# Patient Record
Sex: Male | Born: 1955 | Race: White | Hispanic: No | Marital: Married | Smoking: Former smoker
Health system: Southern US, Community
[De-identification: ages and names within clinical notes are randomized; demographics above are authoritative.]

## PROBLEM LIST (undated history)

## (undated) DIAGNOSIS — M75101 Unspecified rotator cuff tear or rupture of right shoulder, not specified as traumatic: Secondary | ICD-10-CM

## (undated) DIAGNOSIS — L409 Psoriasis, unspecified: Secondary | ICD-10-CM

## (undated) DIAGNOSIS — C679 Malignant neoplasm of bladder, unspecified: Secondary | ICD-10-CM

## (undated) DIAGNOSIS — R7303 Prediabetes: Secondary | ICD-10-CM

## (undated) DIAGNOSIS — M1990 Unspecified osteoarthritis, unspecified site: Secondary | ICD-10-CM

## (undated) DIAGNOSIS — E785 Hyperlipidemia, unspecified: Secondary | ICD-10-CM

## (undated) DIAGNOSIS — K219 Gastro-esophageal reflux disease without esophagitis: Secondary | ICD-10-CM

## (undated) DIAGNOSIS — I10 Essential (primary) hypertension: Secondary | ICD-10-CM

## (undated) DIAGNOSIS — E119 Type 2 diabetes mellitus without complications: Secondary | ICD-10-CM

## (undated) DIAGNOSIS — G5600 Carpal tunnel syndrome, unspecified upper limb: Secondary | ICD-10-CM

## (undated) DIAGNOSIS — C801 Malignant (primary) neoplasm, unspecified: Secondary | ICD-10-CM

## (undated) HISTORY — PX: CYSTOURETHROSCOPY: SHX476

## (undated) HISTORY — PX: CHOLECYSTECTOMY: SHX55

## (undated) HISTORY — PX: HERNIA REPAIR: SHX51

## (undated) HISTORY — PX: TRANSURETHRAL RESECTION OF BLADDER TUMOR WITH GYRUS (TURBT-GYRUS): SHX6458

## (undated) HISTORY — PX: JOINT REPLACEMENT: SHX530

---

## 2015-07-21 DIAGNOSIS — C801 Malignant (primary) neoplasm, unspecified: Secondary | ICD-10-CM

## 2015-07-21 HISTORY — DX: Malignant (primary) neoplasm, unspecified: C80.1

## 2017-04-08 ENCOUNTER — Encounter: Payer: Self-pay | Admitting: *Deleted

## 2017-04-09 ENCOUNTER — Ambulatory Visit: Payer: BLUE CROSS/BLUE SHIELD | Admitting: Anesthesiology

## 2017-04-09 ENCOUNTER — Encounter: Payer: Self-pay | Admitting: *Deleted

## 2017-04-09 ENCOUNTER — Ambulatory Visit
Admission: RE | Admit: 2017-04-09 | Discharge: 2017-04-09 | Disposition: A | Discharge status: Home / Self Care | Payer: BLUE CROSS/BLUE SHIELD | Source: Ambulatory Visit | Attending: Unknown Physician Specialty | Admitting: Unknown Physician Specialty

## 2017-04-09 ENCOUNTER — Encounter
Admission: RE | Disposition: A | Discharge status: Home / Self Care | Payer: Self-pay | Source: Ambulatory Visit | Attending: Unknown Physician Specialty

## 2017-04-09 DIAGNOSIS — E119 Type 2 diabetes mellitus without complications: Secondary | ICD-10-CM | POA: Insufficient documentation

## 2017-04-09 DIAGNOSIS — K640 First degree hemorrhoids: Secondary | ICD-10-CM | POA: Diagnosis not present

## 2017-04-09 DIAGNOSIS — Z1211 Encounter for screening for malignant neoplasm of colon: Secondary | ICD-10-CM | POA: Insufficient documentation

## 2017-04-09 DIAGNOSIS — Z8551 Personal history of malignant neoplasm of bladder: Secondary | ICD-10-CM | POA: Insufficient documentation

## 2017-04-09 DIAGNOSIS — Z87891 Personal history of nicotine dependence: Secondary | ICD-10-CM | POA: Insufficient documentation

## 2017-04-09 DIAGNOSIS — Z79899 Other long term (current) drug therapy: Secondary | ICD-10-CM | POA: Diagnosis not present

## 2017-04-09 DIAGNOSIS — K635 Polyp of colon: Secondary | ICD-10-CM | POA: Diagnosis not present

## 2017-04-09 DIAGNOSIS — I10 Essential (primary) hypertension: Secondary | ICD-10-CM | POA: Diagnosis not present

## 2017-04-09 DIAGNOSIS — Z6838 Body mass index (BMI) 38.0-38.9, adult: Secondary | ICD-10-CM | POA: Diagnosis not present

## 2017-04-09 HISTORY — DX: Malignant (primary) neoplasm, unspecified: C80.1

## 2017-04-09 HISTORY — DX: Type 2 diabetes mellitus without complications: E11.9

## 2017-04-09 HISTORY — PX: COLONOSCOPY WITH PROPOFOL: SHX5780

## 2017-04-09 HISTORY — DX: Carpal tunnel syndrome, unspecified upper limb: G56.00

## 2017-04-09 HISTORY — DX: Essential (primary) hypertension: I10

## 2017-04-09 SURGERY — COLONOSCOPY WITH PROPOFOL
Anesthesia: General

## 2017-04-09 MED ORDER — LIDOCAINE HCL (PF) 2 % IJ SOLN
INTRAMUSCULAR | Status: DC | PRN
  Administered 2017-04-09: 40 mg

## 2017-04-09 MED ORDER — LIDOCAINE HCL (PF) 2 % IJ SOLN
INTRAMUSCULAR | Status: AC
  Filled 2017-04-09: qty 2

## 2017-04-09 MED ORDER — SODIUM CHLORIDE 0.9 % IV SOLN
INTRAVENOUS | Status: DC
  Administered 2017-04-09: 10:00:00 via INTRAVENOUS
  Administered 2017-04-09: 1000 mL via INTRAVENOUS

## 2017-04-09 MED ORDER — FENTANYL CITRATE (PF) 100 MCG/2ML IJ SOLN
INTRAMUSCULAR | Status: AC
  Filled 2017-04-09: qty 2

## 2017-04-09 MED ORDER — PROPOFOL 10 MG/ML IV BOLUS
INTRAVENOUS | Status: DC | PRN
  Administered 2017-04-09: 20 mg via INTRAVENOUS
  Administered 2017-04-09: 30 mg via INTRAVENOUS

## 2017-04-09 MED ORDER — FENTANYL CITRATE (PF) 100 MCG/2ML IJ SOLN
INTRAMUSCULAR | Status: DC | PRN
Start: 2017-04-09 — End: 2017-04-09
  Administered 2017-04-09 (×2): 50 ug via INTRAVENOUS

## 2017-04-09 MED ORDER — MIDAZOLAM HCL 5 MG/5ML IJ SOLN
INTRAMUSCULAR | Status: DC | PRN
  Administered 2017-04-09: 2 mg via INTRAVENOUS

## 2017-04-09 MED ORDER — PROPOFOL 500 MG/50ML IV EMUL
INTRAVENOUS | Status: AC
  Filled 2017-04-09: qty 50

## 2017-04-09 MED ORDER — MIDAZOLAM HCL 2 MG/2ML IJ SOLN
INTRAMUSCULAR | Status: AC
  Filled 2017-04-09: qty 2

## 2017-04-09 MED ORDER — PROPOFOL 500 MG/50ML IV EMUL
INTRAVENOUS | Status: DC | PRN
  Administered 2017-04-09: 75 ug/kg/min via INTRAVENOUS

## 2017-04-09 MED ORDER — SODIUM CHLORIDE 0.9 % IV SOLN: INTRAVENOUS | Status: DC

## 2017-04-09 NOTE — H&P (Signed)
   Primary Care Physician:  Elaina Pattee, MD Primary Gastroenterologist:  Dr. Vira Agar  Pre-Procedure History & Physical: HPI:  Patrick Mason is a 61 y.o. male is here for an colonoscopy.   Past Medical History:  Diagnosis Date  . 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 Carpal tunnel syndrome   . Diabetes mellitus without complication ([place])   . Hypertension     Past Surgical History:  Procedure Laterality Date  . CHOLECYSTECTOMY    . CYSTOURETHROSCOPY    . HERNIA REPAIR    . TRANSURETHRAL RESECTION OF BLADDER TUMOR WITH GYRUS (TURBT-GYRUS)      Prior to Admission medications   Medication Sig Start Date End Date Taking? Authorizing Provider  atorvastatin (LIPITOR) 10 MG tablet Take 10 mg by mouth daily.   Yes [provider]  losartan-hydrochlorothiazide (HYZAAR) 100-12.5 MG tablet Take 1 tablet by mouth daily.   Yes [provider]  meloxicam (MOBIC) 15 MG tablet Take 15 mg by mouth daily.   Yes [provider]    Allergies as of 12/29/2016  . (Not on File)    History reviewed. No pertinent family history.  Social History   Social History  . Marital status: Married    Spouse name: N/A  . Number of children: N/A  . Years of education: N/A   Occupational History  . Not on file.   Social History Main Topics  . Smoking status: Former Smoker    Years: 40.00    Types: Cigarettes  . Smokeless tobacco: Never Used  . Alcohol use Yes     Comment: OCCASSIONAL  . Drug use: No  . Sexual activity: Not on file   Other Topics Concern  . Not on file   Social History Narrative  . No narrative on file    Review of Systems: See HPI, otherwise negative ROS  Physical Exam: BP 140/68   Pulse 92   Temp (!) 97.1 F (36.2 C) (Tympanic)   Resp 16   Ht 5\' 9"  (1.753 m)   Wt 117.9 kg (260 lb)   SpO2 96%   BMI 38.40 kg/m  General:   Alert,  pleasant and cooperative in NAD Head:  Normocephalic and atraumatic. Neck:  Supple; no  masses or thyromegaly. Lungs:  Clear throughout to auscultation.    Heart:  Regular rate and rhythm. Abdomen:  Soft, nontender and nondistended. Normal bowel sounds, without guarding, and without rebound.   Neurologic:  Alert and  oriented x4;  grossly normal neurologically.  Impression/Plan: Patrick Mason is here for an colonoscopy to be performed for colon cancer screening.  Risks, benefits, limitations, and alternatives regarding  colonoscopy have been reviewed with the patient.  Questions have been answered.  All parties agreeable.   Gaylyn Cheers, MD  04/09/2017, 9:05 AM

## 2017-04-09 NOTE — Anesthesia Preprocedure Evaluation (Signed)
Anesthesia Evaluation  Patient identified by MRN, date of birth, ID band Patient awake    Reviewed: Allergy & Precautions, H&P , NPO status , Patient's Chart, lab work & pertinent test results, reviewed documented beta blocker date and time   Airway Mallampati: IV   Neck ROM: full    Dental  (+) Poor Dentition, Teeth Intact   Pulmonary neg pulmonary ROS, former smoker,  Probable sleep apnea.  Never tested.    Pulmonary exam normal        Cardiovascular Exercise Tolerance: Good hypertension, On Medications negative cardio ROS Normal cardiovascular exam Rhythm:regular Rate:Normal     Neuro/Psych  Neuromuscular disease negative neurological ROS  negative psych ROS   GI/Hepatic negative GI ROS, Neg liver ROS,   Endo/Other  negative endocrine ROSdiabetesMorbid obesity  Renal/GU negative Renal ROS  negative genitourinary   Musculoskeletal   Abdominal   Peds  Hematology negative hematology ROS (+)   Anesthesia Other Findings Past Medical History: No date: Cancer ([place])     Comment:  posterior wall of bladder No date: Carpal tunnel syndrome No date: Diabetes mellitus without complication ([facility]) No date: Hypertension Past Surgical History: No date: CHOLECYSTECTOMY No date: CYSTOURETHROSCOPY No date: HERNIA REPAIR No date: TRANSURETHRAL RESECTION OF BLADDER TUMOR WITH GYRUS (TURBT- GYRUS) BMI    Body Mass Index:  38.40 kg/m     Reproductive/Obstetrics negative OB ROS                             Anesthesia Physical Anesthesia Plan  ASA: III  Anesthesia Plan: General   Post-op Pain Management:    Induction:   PONV Risk Score and Plan:   Airway Management Planned:   Additional Equipment:   Intra-op Plan:   Post-operative Plan:   Informed Consent: I have reviewed the patients History and Physical, chart, labs and discussed the procedure including the risks, benefits and  alternatives for the proposed anesthesia with the patient or authorized representative who has indicated his/her understanding and acceptance.   Dental Advisory Given  Plan Discussed with: CRNA  Anesthesia Plan Comments:         Anesthesia Quick Evaluation

## 2017-04-09 NOTE — Op Note (Signed)
[facility] Gastroenterology Patient Name: Patrick Mason Procedure Date: 04/09/2017 9:02 AM MRN: [id number] Account #: [id number] Date of Birth: 1955/12/25 Admit Type: Outpatient Age: 61 Room: [facility] ENDO ROOM 3 Gender: Male Note Status: Finalized Procedure:            Colonoscopy Indications:          Screening for colorectal malignant neoplasm Providers:            Manya Silvas, MD Medicines:            Propofol per Anesthesia Complications:        No immediate complications. Procedure:            Pre-Anesthesia Assessment:                       - After reviewing the risks and benefits, the patient                        was deemed in satisfactory condition to undergo the                        procedure.                       After obtaining informed consent, the colonoscope was                        passed under direct vision. Throughout the procedure,                        the patient's blood pressure, pulse, and oxygen                        saturations were monitored continuously. The                        Colonoscope was introduced through the anus and                        advanced to the the cecum, identified by appendiceal                        orifice and ileocecal valve. The colonoscopy was                        performed without difficulty. The patient tolerated the                        procedure well. The quality of the bowel preparation                        was excellent. Findings:      Three sessile polyps were found in the sigmoid colon. The polyps were       small in size. These polyps were removed with a hot snare. Resection and       retrieval were complete.      Internal hemorrhoids were found during endoscopy. The hemorrhoids were       small and Grade I (internal hemorrhoids that do not prolapse).      The exam was otherwise without abnormality. Impression:           -  Three small polyps in the sigmoid colon, removed with                       a hot snare. Resected and retrieved.                       - Internal hemorrhoids.                       - The examination was otherwise normal. Recommendation:       - Await pathology results. Manya Silvas, MD 04/09/2017 9:38:23 AM This report has been signed electronically. Number of Addenda: 0 Note Initiated On: 04/09/2017 9:02 AM Scope Withdrawal Time: 0 hours 12 minutes 46 seconds  Total Procedure Duration: 0 hours 22 minutes 7 seconds       [facility]

## 2017-04-09 NOTE — Anesthesia Post-op Follow-up Note (Signed)
Anesthesia QCDR form completed.        

## 2017-04-09 NOTE — Transfer of Care (Signed)
Immediate Anesthesia Transfer of Care Note  Patient: Patrick Mason  Procedure(s) Performed: Procedure(s): COLONOSCOPY WITH PROPOFOL (N/A)  Patient Location: PACU  Anesthesia Type:General  Level of Consciousness: sedated  Airway & Oxygen Therapy: Patient Spontanous Breathing and Patient connected to nasal cannula oxygen  Post-op Assessment: Report given to RN and Post -op Vital signs reviewed and stable  Post vital signs: Reviewed and stable  Last Vitals:  Vitals:   04/09/17 0745  BP: 140/68  Pulse: 92  Resp: 16  Temp: (!) 36.2 C  SpO2: 96%    Last Pain:  Vitals:   04/09/17 0745  TempSrc: Tympanic         Complications: No apparent anesthesia complications

## 2017-04-12 ENCOUNTER — Encounter: Payer: Self-pay | Admitting: Unknown Physician Specialty

## 2017-04-12 LAB — SURGICAL PATHOLOGY

## 2017-04-12 NOTE — Anesthesia Postprocedure Evaluation (Signed)
Anesthesia Post Note  Patient: Jaxton Bryden  Procedure(s) Performed: Procedure(s) (LRB): COLONOSCOPY WITH PROPOFOL (N/A)  Patient location during evaluation: PACU Anesthesia Type: General Level of consciousness: awake and alert Pain management: pain level controlled Vital Signs Assessment: post-procedure vital signs reviewed and stable Respiratory status: spontaneous breathing, nonlabored ventilation, respiratory function stable and patient connected to nasal cannula oxygen Cardiovascular status: blood pressure returned to baseline and stable Postop Assessment: no apparent nausea or vomiting Anesthetic complications: no     Last Vitals:  Vitals:   04/09/17 1001 04/09/17 1011  BP: 133/80 123/80  Pulse: 85 83  Resp: 16 14  Temp:    SpO2: 96% 97%    Last Pain:  Vitals:   04/09/17 0941  TempSrc: Tympanic                 Molli Barrows

## 2017-05-05 ENCOUNTER — Other Ambulatory Visit: Payer: Self-pay | Admitting: Unknown Physician Specialty

## 2017-05-05 DIAGNOSIS — M75101 Unspecified rotator cuff tear or rupture of right shoulder, not specified as traumatic: Secondary | ICD-10-CM

## 2017-05-05 DIAGNOSIS — G5603 Carpal tunnel syndrome, bilateral upper limbs: Secondary | ICD-10-CM

## 2017-05-10 ENCOUNTER — Encounter
Admission: RE | Admit: 2017-05-10 | Discharge: 2017-05-10 | Disposition: A | Discharge status: Home / Self Care | Payer: BLUE CROSS/BLUE SHIELD | Source: Ambulatory Visit | Attending: Unknown Physician Specialty | Admitting: Unknown Physician Specialty

## 2017-05-10 DIAGNOSIS — E119 Type 2 diabetes mellitus without complications: Secondary | ICD-10-CM | POA: Diagnosis not present

## 2017-05-10 DIAGNOSIS — Z79899 Other long term (current) drug therapy: Secondary | ICD-10-CM | POA: Diagnosis not present

## 2017-05-10 DIAGNOSIS — I10 Essential (primary) hypertension: Secondary | ICD-10-CM | POA: Diagnosis not present

## 2017-05-10 DIAGNOSIS — G5603 Carpal tunnel syndrome, bilateral upper limbs: Secondary | ICD-10-CM | POA: Diagnosis not present

## 2017-05-10 LAB — CBC
HEMATOCRIT: 43.9 % (ref 40.0–52.0)
HEMOGLOBIN: 14.9 g/dL (ref 13.0–18.0)
MCH: 30.9 pg (ref 26.0–34.0)
MCHC: 33.9 g/dL (ref 32.0–36.0)
MCV: 91.4 fL (ref 80.0–100.0)
Platelets: 270 10*3/uL (ref 150–440)
RBC: 4.8 MIL/uL (ref 4.40–5.90)
RDW: 13.3 % (ref 11.5–14.5)
WBC: 5.8 10*3/uL (ref 3.8–10.6)

## 2017-05-10 LAB — BASIC METABOLIC PANEL
ANION GAP: 9 (ref 5–15)
BUN: 12 mg/dL (ref 6–20)
CHLORIDE: 101 mmol/L (ref 101–111)
CO2: 28 mmol/L (ref 22–32)
CREATININE: 0.89 mg/dL (ref 0.61–1.24)
Calcium: 9.4 mg/dL (ref 8.9–10.3)
GFR calc non Af Amer: >60 mL/min (ref >60)
Glucose, Bld: 119 mg/dL — ABNORMAL HIGH (ref 65–99)
Potassium: 3.8 mmol/L (ref 3.5–5.1)
Sodium: 138 mmol/L (ref 135–145)

## 2017-05-10 NOTE — Patient Instructions (Signed)
Your procedure is scheduled on: Wednesday 05/12/17 Report to [facility]. 2ND FLOOR MEDICAL MALL ENTRANCE. To find out your arrival time please call [phone] between 1PM - 3PM on Tuesday 05/11/17.  Remember: Instructions that are not followed completely may result in serious medical risk, up to and including death, or upon the discretion of your surgeon and anesthesiologist your surgery may need to be rescheduled.    __X__ 1. Do not eat anything after midnight the night before your    procedure.  No gum chewing or hard candies.  You may drink clear   liquids up to 2 hours before you are scheduled to arrive at the   hospital for your procedure. Do not drink clear liquids within 2   hours of scheduled arrival to the hospital as this may lead to your   procedure being delayed or rescheduled.       Clear liquids include:   Water or Apple juice without pulp   Clear carbohydrate beverage such as Clearfast or Gatorade   Black coffee or Clear Tea (no milk, no creamer, do not add anything   to the coffee or tea)    Diabetics should only drink water   __X__ 2. No Alcohol for 24 hours before or after surgery.   ____ 3. Bring all medications with you on the day of surgery if instructed.    __X__ 4. Notify your doctor if there is any change in your medical condition     (cold, fever, infections).             __X___5. No smoking within 24 hours of your surgery.     Do not wear jewelry, make-up, hairpins, clips or nail polish.  Do not wear lotions, powders, or perfumes.   Do not shave 48 hours prior to surgery. Men may shave face and neck.  Do not bring valuables to the hospital.    [facility] is not responsible for any belongings or valuables.               Contacts, dentures or bridgework may not be worn into surgery.  Leave your suitcase in the car. After surgery it may be brought to your room.  For patients admitted to the hospital, discharge time is determined by your                 treatment team.   Patients discharged the day of surgery will not be allowed to drive home.   Please read over the following fact sheets that you were given:   MRSA Information   __X__ Take these medicines the morning of surgery with A SIP OF WATER:    1. ATORVASTATIN  2.   3.   4.  5.  6.  ____ Fleet Enema (as directed)   __X__ Use CHG Soap/SAGE wipes as directed  ____ Use inhalers on the day of surgery  ____ Stop metformin 2 days prior to surgery    ____ Take 1/2 of usual insulin dose the night before surgery and none on the morning of surgery.   ____ Stop Coumadin/Plavix/aspirin on   __X__ Stop Anti-inflammatories such as Advil, Aleve, Ibuprofen, Motrin, Naproxen, Naprosyn, Goodies,powder, MELOXICAM or aspirin products 7 DAYS PRIOR.  OK to take Tylenol.   __X__ Stop supplements, Vitamin E, Fish Oil until after surgery.    ____ Bring C-Pap to the hospital.

## 2017-05-12 ENCOUNTER — Ambulatory Visit
Admission: RE | Admit: 2017-05-12 | Discharge: 2017-05-12 | Disposition: A | Discharge status: Skilled Nursing Facility | Payer: BLUE CROSS/BLUE SHIELD | Source: Ambulatory Visit | Attending: Unknown Physician Specialty | Admitting: Unknown Physician Specialty

## 2017-05-12 ENCOUNTER — Encounter
Admission: RE | Disposition: A | Discharge status: Skilled Nursing Facility | Payer: Self-pay | Source: Ambulatory Visit | Attending: Unknown Physician Specialty

## 2017-05-12 ENCOUNTER — Ambulatory Visit: Payer: BLUE CROSS/BLUE SHIELD | Admitting: Anesthesiology

## 2017-05-12 DIAGNOSIS — E119 Type 2 diabetes mellitus without complications: Secondary | ICD-10-CM | POA: Insufficient documentation

## 2017-05-12 DIAGNOSIS — G5603 Carpal tunnel syndrome, bilateral upper limbs: Secondary | ICD-10-CM | POA: Insufficient documentation

## 2017-05-12 DIAGNOSIS — I10 Essential (primary) hypertension: Secondary | ICD-10-CM | POA: Insufficient documentation

## 2017-05-12 DIAGNOSIS — Z79899 Other long term (current) drug therapy: Secondary | ICD-10-CM | POA: Insufficient documentation

## 2017-05-12 HISTORY — PX: BILATERAL CARPAL TUNNEL RELEASE: SHX6508

## 2017-05-12 LAB — GLUCOSE, CAPILLARY: GLUCOSE-CAPILLARY: 105 mg/dL — AB (ref 65–99)

## 2017-05-12 SURGERY — BILATERAL CARPAL TUNNEL RELEASE
Anesthesia: General | Laterality: Bilateral | Wound class: Clean

## 2017-05-12 MED ORDER — BUPIVACAINE HCL (PF) 0.5 % IJ SOLN
INTRAMUSCULAR | Status: DC | PRN
  Administered 2017-05-12: 10 mL

## 2017-05-12 MED ORDER — SUCCINYLCHOLINE CHLORIDE 20 MG/ML IJ SOLN
INTRAMUSCULAR | Status: AC
  Filled 2017-05-12: qty 1

## 2017-05-12 MED ORDER — MIDAZOLAM HCL 2 MG/2ML IJ SOLN
INTRAMUSCULAR | Status: AC
  Filled 2017-05-12: qty 2

## 2017-05-12 MED ORDER — DEXAMETHASONE SODIUM PHOSPHATE 10 MG/ML IJ SOLN
INTRAMUSCULAR | Status: AC
  Filled 2017-05-12: qty 1

## 2017-05-12 MED ORDER — FENTANYL CITRATE (PF) 100 MCG/2ML IJ SOLN
INTRAMUSCULAR | Status: AC
  Filled 2017-05-12: qty 2

## 2017-05-12 MED ORDER — ONDANSETRON HCL 4 MG/2ML IJ SOLN
INTRAMUSCULAR | Status: AC
  Filled 2017-05-12: qty 2

## 2017-05-12 MED ORDER — LIDOCAINE HCL 2 % EX GEL
CUTANEOUS | Status: AC
  Filled 2017-05-12: qty 5

## 2017-05-12 MED ORDER — MIDAZOLAM HCL 2 MG/2ML IJ SOLN
INTRAMUSCULAR | Status: DC | PRN
  Administered 2017-05-12: 2 mg via INTRAVENOUS

## 2017-05-12 MED ORDER — LACTATED RINGERS IV SOLN
INTRAVENOUS | Status: DC | PRN
  Administered 2017-05-12: 14:00:00 via INTRAVENOUS

## 2017-05-12 MED ORDER — PROMETHAZINE HCL 25 MG/ML IJ SOLN: 6.2500 mg | INTRAMUSCULAR | Status: DC | PRN

## 2017-05-12 MED ORDER — MEPERIDINE HCL 50 MG/ML IJ SOLN: 6.2500 mg | INTRAMUSCULAR | Status: DC | PRN

## 2017-05-12 MED ORDER — OXYCODONE HCL 5 MG PO TABS: 5.0000 mg | ORAL_TABLET | Freq: Once | ORAL | Status: DC | PRN

## 2017-05-12 MED ORDER — FAMOTIDINE 20 MG PO TABS
20.0000 mg | ORAL_TABLET | Freq: Once | ORAL | Status: AC
  Administered 2017-05-12: 20 mg via ORAL

## 2017-05-12 MED ORDER — ONDANSETRON HCL 4 MG/2ML IJ SOLN
INTRAMUSCULAR | Status: DC | PRN
Start: 2017-05-12 — End: 2017-05-12
  Administered 2017-05-12: 4 mg via INTRAVENOUS

## 2017-05-12 MED ORDER — FENTANYL CITRATE (PF) 100 MCG/2ML IJ SOLN: 25.0000 ug | INTRAMUSCULAR | Status: DC | PRN

## 2017-05-12 MED ORDER — PROPOFOL 10 MG/ML IV BOLUS
INTRAVENOUS | Status: AC
  Filled 2017-05-12: qty 20

## 2017-05-12 MED ORDER — FENTANYL CITRATE (PF) 100 MCG/2ML IJ SOLN
INTRAMUSCULAR | Status: DC | PRN
  Administered 2017-05-12 (×3): 25 ug via INTRAVENOUS
  Administered 2017-05-12: 50 ug via INTRAVENOUS
  Administered 2017-05-12: 25 ug via INTRAVENOUS

## 2017-05-12 MED ORDER — DEXAMETHASONE SODIUM PHOSPHATE 10 MG/ML IJ SOLN
INTRAMUSCULAR | Status: DC | PRN
  Administered 2017-05-12: 10 mg via INTRAVENOUS

## 2017-05-12 MED ORDER — EPHEDRINE SULFATE 50 MG/ML IJ SOLN
INTRAMUSCULAR | Status: AC
  Filled 2017-05-12: qty 1

## 2017-05-12 MED ORDER — LIDOCAINE HCL (CARDIAC) 20 MG/ML IV SOLN
INTRAVENOUS | Status: DC | PRN
  Administered 2017-05-12: 40 mg via INTRAVENOUS

## 2017-05-12 MED ORDER — PROPOFOL 10 MG/ML IV BOLUS
INTRAVENOUS | Status: DC | PRN
  Administered 2017-05-12: 20 mg via INTRAVENOUS
  Administered 2017-05-12: 170 mg via INTRAVENOUS

## 2017-05-12 MED ORDER — LIDOCAINE HCL (PF) 2 % IJ SOLN
INTRAMUSCULAR | Status: AC
  Filled 2017-05-12: qty 10

## 2017-05-12 MED ORDER — NORCO 5-325 MG PO TABS: 1.0000 | ORAL_TABLET | Freq: Four times a day (QID) | ORAL | 0 refills | Status: DC | PRN

## 2017-05-12 MED ORDER — FAMOTIDINE 20 MG PO TABS
ORAL_TABLET | ORAL | Status: AC
  Administered 2017-05-12: 20 mg via ORAL
  Filled 2017-05-12: qty 1

## 2017-05-12 MED ORDER — OXYCODONE HCL 5 MG/5ML PO SOLN: 5.0000 mg | Freq: Once | ORAL | Status: DC | PRN

## 2017-05-12 MED ORDER — SODIUM CHLORIDE 0.9 % IV SOLN: INTRAVENOUS | Status: DC

## 2017-05-12 SURGICAL SUPPLY — 32 items
BANDAGE ELASTIC 2 LF NS (GAUZE/BANDAGES/DRESSINGS) ×3 IMPLANT
BANDAGE STRETCH 3X4.1 STRL (GAUZE/BANDAGES/DRESSINGS) ×6 IMPLANT
BLADE SURG 15 STRL LF DISP TIS (BLADE) ×2 IMPLANT
BLADE SURG 15 STRL SS (BLADE) ×4
BNDG ESMARK 4X12 TAN STRL LF (GAUZE/BANDAGES/DRESSINGS) ×3 IMPLANT
CHLORAPREP W/TINT 26ML (MISCELLANEOUS) ×3 IMPLANT
CUFF TOURN 18 STER (MISCELLANEOUS) ×6 IMPLANT
ELECT REM PT RETURN 9FT ADLT (ELECTROSURGICAL) ×3
ELECTRODE REM PT RTRN 9FT ADLT (ELECTROSURGICAL) ×1 IMPLANT
GAUZE SPONGE 4X4 12PLY STRL (GAUZE/BANDAGES/DRESSINGS) ×6 IMPLANT
GLOVE BIO SURGEON STRL SZ7.5 (GLOVE) ×18 IMPLANT
GLOVE BIO SURGEON STRL SZ8 (GLOVE) ×6 IMPLANT
GLOVE BIOGEL M STRL SZ7.5 (GLOVE) ×12 IMPLANT
GLOVE INDICATOR 8.0 STRL GRN (GLOVE) ×3 IMPLANT
GOWN STRL REUS W/ TWL LRG LVL3 (GOWN DISPOSABLE) ×2 IMPLANT
GOWN STRL REUS W/TWL LRG LVL3 (GOWN DISPOSABLE) ×4
KIT RM TURNOVER STRD PROC AR (KITS) ×3 IMPLANT
NS IRRIG 500ML POUR BTL (IV SOLUTION) ×3 IMPLANT
PACK EXTREMITY ARMC (MISCELLANEOUS) ×3 IMPLANT
PADDING CAST 2X4YD ST (MISCELLANEOUS) ×2
PADDING CAST BLEND 2X4 STRL (MISCELLANEOUS) ×1 IMPLANT
SOL PREP PVP 2OZ (MISCELLANEOUS) ×3
SOLUTION PREP PVP 2OZ (MISCELLANEOUS) ×1 IMPLANT
SPLINT CAST 1 STEP 3X12 (MISCELLANEOUS) ×3 IMPLANT
SPLINT WRIST XL LT TX990310 (SOFTGOODS) ×3 IMPLANT
SPLINT WRIST XL RT TX990305 (SOFTGOODS) ×3 IMPLANT
STOCKINETTE STRL 4IN 9604848 (GAUZE/BANDAGES/DRESSINGS) ×3 IMPLANT
SUT ETHILON 4-0 (SUTURE) ×4
SUT ETHILON 4-0 FS2 18XMFL BLK (SUTURE) ×2
SUTURE ETHLN 4-0 FS2 18XMF BLK (SUTURE) ×2 IMPLANT
TUBING CONNECTING 10 (TUBING) ×2 IMPLANT
TUBING CONNECTING 10' (TUBING) ×1

## 2017-05-12 NOTE — Op Note (Signed)
DATE OF SURGERY:  05/12/2017  PATIENT NAME:  Arjan Strohm Alatorre   DOB: Aug 24, 1955  MRN: [id number]  PRE-OPERATIVE DIAGNOSIS: Bilateral carpal tunnel syndrome  POST-OPERATIVE DIAGNOSIS:  Same  PROCEDURE: Bilateral carpal tunnel releases  SURGEON: Dr. Leanor Kail, Brooke Bonito. M.D.  ANESTHESIA: Gen.   INDICATIONS FOR SURGERY: JAMAL PAVON is a 61 y.o. year old male with a long history of numbness and paresthesias in both hands. Nerve conduction studies demonstrated findings consistent with severe bilateral  median nerve compression.The patient had not seen any significant improvement despite conservative nonsurgical intervention. After discussion of the risks and benefits of surgical intervention, the patient expressed understanding of the risks benefits and agreed with plans for bilateral carpal tunnel releases.   PROCEDURE IN DETAIL: The patient was taken the operating room where satisfactory general anesthesia was achieved. A tourniquet was placed on the patient's right forearm.The right hand and arm were prepped  and draped in the usual sterile fashion. A "time-out" was performed as per usual protocol. The hand and forearm were exsanguinated using an Esmarch and the tourniquet was inflated to 200 mmHg.  An incision was made just ulnar to the thenar palmar crease. Dissection was carried down through the palmar fascia to the transverse carpal ligament. The transverse carpal ligament was sharply incised, taking care to protect the underlying structures within the carpal tunnel. Complete release of the transverse carpal ligament was achieved. There was no evidence of a mass or proliferative synovitis within the carpal tunnel. The median nerve did appear to be slightly flattened. The wound was irrigated with saline. The tourniquet was released at this time. It had been up for about 10 minutes. Bleeding was controlled with digital pressure and coagulation cautery. I did inject the subcutaneous tissue of the  wound with about 5 cc of 0.5% Marcaine without epinephrine. The skin was then re-approximated with interrupted sutures of #4-0 nylon. A sterile dressing was applied followed by application of a volar splint.  I then turned my attention to the left upper extremity. Tourniquet was applied to the left upper arm and the left hand, wrist and forearm were prepped and draped in usual fashion for a procedure about the wrist. The left upper extremity was exsanguinated and the tourniquet was inflated. A slightly curved incision was made over the left carpal tunnel. Dissection was carried down to the palmar fascia. The transverse carpal ligament was identified. It was divided in its entirety freeing up the median nerve. The median nerve underneath the ligament was slightly flattened. No mass was appreciated within the carpal tunnel. There was no evidence for proliferative synovitis. The tourniquet was released at this time. It had been up about 10 minutes. Bleeding was controlled with coagulation cautery. The wound was irrigated with sterile saline. The skin was closed with 4-0 nylon sutures in vertical mattress fashion. Betadine was applied to the wounds followed by a sterile dressing. A removable type of Velcro volar splint was then applied over the dressing.  The patient was then awakened and transferred to a stretcher bed. He was taken to the recovery room in satisfactory condition. Blood loss was negligible.  The patient was awakened and transferred to a stretcher bed.  The patient tolerated the procedure well and was transported to the PACU in stable condition. Blood loss was negligible.  Dr. Mariel Kansky. M.D.

## 2017-05-12 NOTE — Discharge Instructions (Signed)
Ice pack ° °Elevation ° °RTC in about 2 weeks °

## 2017-05-12 NOTE — Anesthesia Procedure Notes (Signed)
Performed by: Nuala Chiles       

## 2017-05-12 NOTE — Anesthesia Preprocedure Evaluation (Signed)
Anesthesia Evaluation  Patient identified by MRN, date of birth, ID band Patient awake    Reviewed: Allergy & Precautions, NPO status , Patient's Chart, lab work & pertinent test results  History of Anesthesia Complications Negative for: history of anesthetic complications  Airway Mallampati: II  TM Distance: >3 FB Neck ROM: Full    Dental no notable dental hx.    Pulmonary neg sleep apnea, neg COPD, former smoker,    breath sounds clear to auscultation- rhonchi (-) wheezing      Cardiovascular Exercise Tolerance: Good hypertension, Pt. on medications (-) CAD, (-) Past MI and (-) Cardiac Stents  Rhythm:Regular Rate:Normal - Systolic murmurs and - Diastolic murmurs    Neuro/Psych negative neurological ROS  negative psych ROS   GI/Hepatic negative GI ROS, Neg liver ROS,   Endo/Other  diabetes  Renal/GU negative Renal ROS     Musculoskeletal negative musculoskeletal ROS (+)   Abdominal (+) + obese,   Peds  Hematology negative hematology ROS (+)   Anesthesia Other Findings Past Medical History: No date: Cancer [facility])     Comment:  posterior wall of bladder No date: Carpal tunnel syndrome No date: Diabetes mellitus without complication ([place])     Comment:  pt denies 05/10/17 No date: Hypertension   Reproductive/Obstetrics                             Anesthesia Physical Anesthesia Plan  ASA: II  Anesthesia Plan: General   Post-op Pain Management:    Induction: Intravenous  PONV Risk Score and Plan: 1 and Ondansetron and Dexamethasone  Airway Management Planned: LMA  Additional Equipment:   Intra-op Plan:   Post-operative Plan:   Informed Consent: I have reviewed the patients History and Physical, chart, labs and discussed the procedure including the risks, benefits and alternatives for the proposed anesthesia with the patient or authorized representative who has indicated  his/her understanding and acceptance.   Dental advisory given  Plan Discussed with: CRNA and Anesthesiologist  Anesthesia Plan Comments:         Anesthesia Quick Evaluation

## 2017-05-12 NOTE — Anesthesia Post-op Follow-up Note (Signed)
Anesthesia QCDR form completed.        

## 2017-05-12 NOTE — Progress Notes (Signed)
Bilateral hands elevated on pillows   Can wiggle fingers both sies  Capillary refill positive bilaterally  Ice packs bilaterally to both wrists

## 2017-05-12 NOTE — Anesthesia Procedure Notes (Signed)
Procedure Name: LMA Insertion Date/Time: 05/12/2017 1:43 PM Performed by: Allean Found Pre-anesthesia Checklist: Patient identified, Emergency Drugs available, Suction available, Patient being monitored and Timeout performed Patient Re-evaluated:Patient Re-evaluated prior to induction Oxygen Delivery Method: Circle system utilized Preoxygenation: Pre-oxygenation with 100% oxygen Induction Type: IV induction Ventilation: Mask ventilation without difficulty LMA: LMA inserted LMA Size: 5.0 Number of attempts: 1 Placement Confirmation: ETT inserted through vocal cords under direct vision,  positive ETCO2 and breath sounds checked- equal and bilateral Tube secured with: Tape Dental Injury: Teeth and Oropharynx as per pre-operative assessment

## 2017-05-12 NOTE — H&P (Signed)
  H and P reviewed. No changes. Uploaded at later date. 

## 2017-05-12 NOTE — Transfer of Care (Signed)
Immediate Anesthesia Transfer of Care Note  Patient: Patrick Mason  Procedure(s) Performed: BILATERAL CARPAL TUNNEL RELEASE (Bilateral )  Patient Location: PACU  Anesthesia Type:General  Level of Consciousness: sedated  Airway & Oxygen Therapy: Patient Spontanous Breathing and Patient connected to face mask oxygen  Post-op Assessment: Report given to RN and Post -op Vital signs reviewed and stable  Post vital signs: Reviewed and stable  Last Vitals:  Vitals:   05/12/17 1241 05/12/17 1514  BP: (!) 163/90 (!) (P) 161/93  Pulse: 98 101  Resp: 20 17  Temp: 37.3 C (!) (P) 36.3 C  SpO2: 92% 100%    Last Pain:  Vitals:   05/12/17 1514  TempSrc:   PainSc: (P) 0-No pain      Patients Stated Pain Goal: 2 (72/82/06 0156)  Complications: No apparent anesthesia complications

## 2017-05-13 ENCOUNTER — Encounter: Payer: Self-pay | Admitting: Unknown Physician Specialty

## 2017-05-13 NOTE — Anesthesia Postprocedure Evaluation (Signed)
Anesthesia Post Note  Patient: Patrick Mason  Procedure(s) Performed: BILATERAL CARPAL TUNNEL RELEASE (Bilateral )  Patient location during evaluation: PACU Anesthesia Type: General Level of consciousness: awake Pain management: pain level controlled Vital Signs Assessment: post-procedure vital signs reviewed and stable Respiratory status: spontaneous breathing Cardiovascular status: stable Anesthetic complications: no     Last Vitals:  Vitals:   05/12/17 1608 05/12/17 1637  BP: 139/66 (!) 142/78  Pulse: 92 89  Resp: 16   Temp: (!) 35.8 C   SpO2: 94% 94%    Last Pain:  Vitals:   05/12/17 1608  TempSrc: Temporal  PainSc:                  VAN STAVEREN,Adriene Padula

## 2017-05-25 ENCOUNTER — Ambulatory Visit
Admission: RE | Admit: 2017-05-25 | Discharge: 2017-05-25 | Disposition: A | Discharge status: Home / Self Care | Payer: BLUE CROSS/BLUE SHIELD | Source: Ambulatory Visit | Attending: Unknown Physician Specialty | Admitting: Unknown Physician Specialty

## 2017-05-25 DIAGNOSIS — M75101 Unspecified rotator cuff tear or rupture of right shoulder, not specified as traumatic: Secondary | ICD-10-CM

## 2017-05-25 DIAGNOSIS — G5603 Carpal tunnel syndrome, bilateral upper limbs: Secondary | ICD-10-CM

## 2017-06-08 ENCOUNTER — Ambulatory Visit: Admission: RE | Admit: 2017-06-08 | Payer: BLUE CROSS/BLUE SHIELD | Source: Ambulatory Visit

## 2017-07-01 ENCOUNTER — Ambulatory Visit
Admission: RE | Admit: 2017-07-01 | Discharge: 2017-07-01 | Disposition: A | Discharge status: Home / Self Care | Payer: BLUE CROSS/BLUE SHIELD | Source: Ambulatory Visit | Attending: Unknown Physician Specialty | Admitting: Unknown Physician Specialty

## 2017-07-01 ENCOUNTER — Encounter (INDEPENDENT_AMBULATORY_CARE_PROVIDER_SITE_OTHER): Payer: Self-pay

## 2017-07-01 DIAGNOSIS — M6250 Muscle wasting and atrophy, not elsewhere classified, unspecified site: Secondary | ICD-10-CM | POA: Diagnosis not present

## 2017-07-01 DIAGNOSIS — M75121 Complete rotator cuff tear or rupture of right shoulder, not specified as traumatic: Secondary | ICD-10-CM | POA: Insufficient documentation

## 2017-07-01 DIAGNOSIS — S43431A Superior glenoid labrum lesion of right shoulder, initial encounter: Secondary | ICD-10-CM | POA: Diagnosis not present

## 2017-07-01 DIAGNOSIS — X58XXXA Exposure to other specified factors, initial encounter: Secondary | ICD-10-CM | POA: Insufficient documentation

## 2017-07-01 DIAGNOSIS — G5603 Carpal tunnel syndrome, bilateral upper limbs: Secondary | ICD-10-CM | POA: Diagnosis not present

## 2017-07-01 DIAGNOSIS — M75101 Unspecified rotator cuff tear or rupture of right shoulder, not specified as traumatic: Secondary | ICD-10-CM | POA: Diagnosis present

## 2017-09-12 ENCOUNTER — Emergency Department
Admission: EM | Admit: 2017-09-12 | Discharge: 2017-09-13 | Disposition: A | Discharge status: Home / Self Care | Payer: BLUE CROSS/BLUE SHIELD | Attending: Emergency Medicine | Admitting: Emergency Medicine

## 2017-09-12 ENCOUNTER — Encounter: Payer: Self-pay | Admitting: Emergency Medicine

## 2017-09-12 DIAGNOSIS — R0602 Shortness of breath: Secondary | ICD-10-CM | POA: Diagnosis not present

## 2017-09-12 DIAGNOSIS — R079 Chest pain, unspecified: Secondary | ICD-10-CM | POA: Insufficient documentation

## 2017-09-12 DIAGNOSIS — Z79899 Other long term (current) drug therapy: Secondary | ICD-10-CM | POA: Diagnosis not present

## 2017-09-12 DIAGNOSIS — E119 Type 2 diabetes mellitus without complications: Secondary | ICD-10-CM | POA: Diagnosis not present

## 2017-09-12 DIAGNOSIS — I10 Essential (primary) hypertension: Secondary | ICD-10-CM | POA: Insufficient documentation

## 2017-09-12 DIAGNOSIS — E785 Hyperlipidemia, unspecified: Secondary | ICD-10-CM | POA: Diagnosis not present

## 2017-09-12 DIAGNOSIS — Z87891 Personal history of nicotine dependence: Secondary | ICD-10-CM | POA: Insufficient documentation

## 2017-09-12 HISTORY — DX: Hyperlipidemia, unspecified: E78.5

## 2017-09-12 HISTORY — DX: Psoriasis, unspecified: L40.9

## 2017-09-12 HISTORY — DX: Unspecified rotator cuff tear or rupture of right shoulder, not specified as traumatic: M75.101

## 2017-09-12 NOTE — ED Triage Notes (Signed)
Patient with complaint of shortness of breath that started tonight. Patient states that the shortness of breath is causing chest pressure.

## 2017-09-13 ENCOUNTER — Emergency Department: Payer: BLUE CROSS/BLUE SHIELD

## 2017-09-13 ENCOUNTER — Encounter: Payer: Self-pay | Admitting: Emergency Medicine

## 2017-09-13 LAB — BASIC METABOLIC PANEL
Anion gap: 9 (ref 5–15)
BUN: 16 mg/dL (ref 6–20)
CALCIUM: 9 mg/dL (ref 8.9–10.3)
CO2: 29 mmol/L (ref 22–32)
CREATININE: 0.73 mg/dL (ref 0.61–1.24)
Chloride: 101 mmol/L (ref 101–111)
GFR calc Af Amer: >60 mL/min (ref >60)
GFR calc non Af Amer: >60 mL/min (ref >60)
GLUCOSE: 131 mg/dL — AB (ref 65–99)
Potassium: 3.9 mmol/L (ref 3.5–5.1)
Sodium: 139 mmol/L (ref 135–145)

## 2017-09-13 LAB — HEPATIC FUNCTION PANEL
ALT: 29 U/L (ref 17–63)
AST: 32 U/L (ref 15–41)
Albumin: 4.1 g/dL (ref 3.5–5.0)
Alkaline Phosphatase: 68 U/L (ref 38–126)
BILIRUBIN DIRECT: 0.1 mg/dL (ref 0.1–0.5)
BILIRUBIN INDIRECT: 0.5 mg/dL (ref 0.3–0.9)
BILIRUBIN TOTAL: 0.6 mg/dL (ref 0.3–1.2)
Total Protein: 7.6 g/dL (ref 6.5–8.1)

## 2017-09-13 LAB — CBC
HCT: 44 % (ref 40.0–52.0)
Hemoglobin: 14.9 g/dL (ref 13.0–18.0)
MCH: 30.5 pg (ref 26.0–34.0)
MCHC: 33.9 g/dL (ref 32.0–36.0)
MCV: 90 fL (ref 80.0–100.0)
PLATELETS: 267 10*3/uL (ref 150–440)
RBC: 4.89 MIL/uL (ref 4.40–5.90)
RDW: 13.3 % (ref 11.5–14.5)
WBC: 9.9 10*3/uL (ref 3.8–10.6)

## 2017-09-13 LAB — INFLUENZA PANEL BY PCR (TYPE A & B)
INFLAPCR: NEGATIVE
Influenza B By PCR: NEGATIVE

## 2017-09-13 LAB — LIPASE, BLOOD: Lipase: 28 U/L (ref 11–51)

## 2017-09-13 LAB — BRAIN NATRIURETIC PEPTIDE: B NATRIURETIC PEPTIDE 5: 28 pg/mL (ref 0.0–100.0)

## 2017-09-13 LAB — TROPONIN I: Troponin I: <0.03 ng/mL (ref <0.03)

## 2017-09-13 MED ORDER — IOPAMIDOL (ISOVUE-370) INJECTION 76%
75.0000 mL | Freq: Once | INTRAVENOUS | Status: AC | PRN
  Administered 2017-09-13: 75 mL via INTRAVENOUS

## 2017-09-13 NOTE — ED Provider Notes (Signed)
[facility] Emergency Department Provider Note  ____________________________________________   First MD Initiated Contact with Patient 09/13/17 0006     (approximate)  I have reviewed the triage vital signs and the nursing notes.   HISTORY  Chief Complaint Shortness of Breath and Chest Pain    HPI Patrick Mason is a 62 y.o. male with medical history as listed below but who has no history of pulmonary or cardiac disease.  He presents for evaluation of acute onset shortness of breath.  Lying flat in bed seem to cause the symptoms and make it worse.  Sitting up made it better.  His wife says that while he was acutely short of breath he was also diaphoretic which is very unusual for him.  She pointed out that he does not complain and really needs to go to the doctor so it is very unusual that he wanted to be evaluated in the emergency department.  The shortness of breath and associated symptoms were severe but he denies any chest pain.  He denies fever/chills, nasal congestion, runny nose, nausea, vomiting, and abdominal pain.  He quit smoking more than 12 years ago, does have a history of hypertension and hyperlipidemia but has no history of diabetes.  He has never had any issues with his heart and has no COPD or CHF.  Past Medical History:  Diagnosis Date  . 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 Carpal tunnel syndrome   . Diabetes mellitus without complication ([place])    pt denies 05/10/17  . Hyperlipidemia   . Hypertension   . Psoriasis   . Right rotator cuff tear     There are no active problems to display for this patient.   Past Surgical History:  Procedure Laterality Date  . BILATERAL CARPAL TUNNEL RELEASE Bilateral 05/12/2017   Procedure: BILATERAL CARPAL TUNNEL RELEASE;  Surgeon: Leanor Kail, MD;  Location: [facility] ORS;  Service: Orthopedics;  Laterality: Bilateral;  . CHOLECYSTECTOMY    . COLONOSCOPY WITH PROPOFOL N/A 04/09/2017   Procedure: COLONOSCOPY WITH PROPOFOL;  Surgeon: Manya Silvas, MD;  Location: [facility] ENDOSCOPY;  Service: Endoscopy;  Laterality: N/A;  . CYSTOURETHROSCOPY    . HERNIA REPAIR    . TRANSURETHRAL RESECTION OF BLADDER TUMOR WITH GYRUS (TURBT-GYRUS)      Prior to Admission medications   Medication Sig Start Date End Date Taking? Authorizing Provider  atorvastatin (LIPITOR) 20 MG tablet Take 1 tablet by mouth daily. 04/25/17  Yes [provider]  losartan-hydrochlorothiazide (HYZAAR) 100-12.5 MG tablet Take 1 tablet by mouth daily.   Yes [provider]  NORCO 5-325 MG tablet Take 1-2 tablets by mouth every 6 (six) hours as needed for moderate pain. MAXIMUM TOTAL ACETAMINOPHEN DOSE IS 4000 MG PER DAY Patient not taking: Reported on 09/12/2017 05/12/17   Leanor Kail, MD    Allergies Patient has no known allergies.  History reviewed. No pertinent family history.  Social History Social History   Tobacco Use  . Smoking status: Former Smoker    Years: 40.00    Types: Cigarettes  . Smokeless tobacco: Never Used  Substance Use Topics  . Alcohol use: Yes    Comment: OCCASSIONAL  . Drug use: No    Review of Systems Constitutional: No fever/chills Eyes: No visual changes. ENT: No sore throat. Cardiovascular: Denies chest pain. Respiratory: Acute onset shortness of breath with lying flat with associated diaphoresis. Gastrointestinal: No abdominal pain.  No nausea, no vomiting.  No  diarrhea.  No constipation. Genitourinary: Negative for dysuria. Musculoskeletal: Negative for neck pain.  Negative for back pain. Integumentary: Negative for rash. Neurological: Negative for headaches, focal weakness or numbness.   ____________________________________________   PHYSICAL EXAM:  VITAL SIGNS: ED Triage Vitals [09/12/17 2318]  Enc Vitals Group     BP (!) 164/99     Pulse Rate (!) 116     Resp 20     Temp 98.5 F (36.9 C)     Temp Source Oral     SpO2 90 %      Weight 121.1 kg (267 lb)     Height 1.727 m (5\' 8" )     Head Circumference      Peak Flow      Pain Score 2     Pain Loc      Pain Edu?      Excl. in [place]?     Constitutional: Alert and oriented. Well appearing and in no acute distress. Eyes: Conjunctivae are normal.  Head: Atraumatic. Nose: No congestion/rhinnorhea. Mouth/Throat: Mucous membranes are moist. Neck: No stridor.  No meningeal signs.   Cardiovascular: Normal rate, regular rhythm. Good peripheral circulation. Grossly normal heart sounds. Respiratory: Increased respiratory rate but with No retractions. Lungs CTAB. Gastrointestinal: Soft and nontender. No distention.  Musculoskeletal: No lower extremity tenderness nor edema. No gross deformities of extremities. Neurologic:  Normal speech and language. No gross focal neurologic deficits are appreciated.  Skin:  Skin is warm, dry and intact. No rash noted. Psychiatric: Mood and affect are normal. Speech and behavior are normal.  ____________________________________________   LABS (all labs ordered are listed, but only abnormal results are displayed)  Labs Reviewed  BASIC METABOLIC PANEL - Abnormal; Notable for the following components:      Result Value   Glucose, Bld 131 (*)    All other components within normal limits  CBC  TROPONIN I  HEPATIC FUNCTION PANEL  LIPASE, BLOOD  BRAIN NATRIURETIC PEPTIDE  INFLUENZA PANEL BY PCR (TYPE A & B)  TROPONIN I   ____________________________________________  EKG  ED ECG REPORT I, Hinda Kehr, the attending physician, personally viewed and interpreted this ECG.  Date: 09/12/2017 EKG Time: 23: 13 Rate: 116 Rhythm: Sinus tachycardia QRS Axis: normal Intervals: normal ST/T Wave abnormalities: normal Narrative Interpretation: no evidence of acute ischemia  ____________________________________________  RADIOLOGY I, Hinda Kehr, personally viewed and evaluated these images (plain radiographs) as part of my medical  decision making, as well as reviewing the written report by the radiologist.  ED MD interpretation: No pneumothorax, no acute infiltrates.  Official radiology report(s): Dg Chest 2 View  Result Date: 09/13/2017 CLINICAL DATA:  Shortness of Breath EXAM: CHEST  2 VIEW COMPARISON:  None. FINDINGS: Heart and mediastinal contours are within normal limits. No focal opacities or effusions. No acute bony abnormality. IMPRESSION: No active cardiopulmonary disease. Electronically Signed   By: Rolm Baptise M.D.   On: 09/13/2017 00:24   Ct Angio Chest Pe W/cm &/or Wo Cm  Result Date: 09/13/2017 CLINICAL DATA:  Shortness of breath, chest pressure. Assess for pulmonary embolism. History of bladder cancer, hypertension. EXAM: CT ANGIOGRAPHY CHEST WITH CONTRAST TECHNIQUE: Multidetector CT imaging of the chest was performed using the standard protocol during bolus administration of intravenous contrast. Multiplanar CT image reconstructions and MIPs were obtained to evaluate the vascular anatomy. CONTRAST:  43mL ISOVUE-370 IOPAMIDOL (ISOVUE-370) INJECTION 76% COMPARISON:  Chest radiograph September 13, 2017 FINDINGS: CARDIOVASCULAR: Adequate contrast opacification of the pulmonary artery's. Main pulmonary artery  is not enlarged. No pulmonary arterial filling defects to the level of the segmental branches. Heart size is normal, no right heart strain. Mild coronary artery calcification. No pericardial effusion. Thoracic aorta is normal course and caliber, mild calcific atherosclerosis. MEDIASTINUM/NODES: No lymphadenopathy by CT size criteria. LUNGS/PLEURA: Tracheobronchial tree is patent, no pneumothorax. No pleural effusions, focal consolidations, pulmonary nodules or masses. UPPER ABDOMEN: Included view of the abdomen is unremarkable. MUSCULOSKELETAL: Nonacute. Severe RIGHT glenohumeral osteoarthrosis. Moderate degenerative change of the thoracic spine with multilevel Schmorl's nodes. Review of the MIP images confirms the  above findings. IMPRESSION: No pulmonary embolism or acute cardiopulmonary process. Aortic Atherosclerosis (ICD10-I70.0). Electronically Signed   By: Elon Alas M.D.   On: 09/13/2017 02:21    ____________________________________________   PROCEDURES  Critical Care performed: No   Procedure(s) performed:   Procedures   ____________________________________________   INITIAL IMPRESSION / ASSESSMENT AND PLAN / ED COURSE  As part of my medical decision making, I reviewed the following data within the [place] notes reviewed and incorporated, Labs reviewed , EKG interpreted  and Radiograph reviewed     Differential includes, but is not limited to, viral syndrome, bronchitis including COPD exacerbation, pneumonia, reactive airway disease including asthma, CHF including exacerbation with or without pulmonary/interstitial edema, pneumothorax, ACS, thoracic trauma, and pulmonary embolism.  Low risk for ACS by HEART score.  No infectious symptoms.  He has no risk factors for pulmonary embolism but he is tachycardic, short of breath with clear lung sounds and tachypnea, and had acute onset of the symptoms which were severe.  The orthopnea component sounds more consistent with CHF but he has no history of CHF and no pulmonary edema on chest x-ray and no peripheral edema.  I will check blood work but I anticipate getting a CTA of his chest to rule out pulmonary embolism as well as to further evaluate his lungs.  This shortness of breath could be consistent with ACS so I will also plan to check a second troponin.  His initial SPO2 was 90% on room air which does seem abnormal and somewhat concerning for someone with no history of lung disease.  He has no infectious symptoms to suggest influenza as a cause even though it is very prevalent in the community at this time.  I will reassess after a broad evaluation.  Clinical Course as of Sep 14 507  Mon Sep 13, 2017    0126 B Natriuretic Peptide: 28.0 [CF]  0126 Normal creatinine, will proceed with CTA chest as mentioned above. Creatinine: 0.73 [CF]  0126 Troponin I: <0.03 [CF]  0451 Influenza A By PCR: NEGATIVE [CF]  8466 I have had 2 separate conversations with the patient about admission.  He continues to be mildly tachycardic but he told me during her most recent conversation that people are always telling him his heart rate is faster than usual.  When we ambulated him, the tach that ambulated him said he dropped down to 88%.  I am not sure where the 84% that is documented came from but reportedly he was slightly hypoxemic with ambulation.  However he states he felt absolutely fine and feels "well enough to walk home".  I offered admission on 2 separate occasions and he declines which I think is appropriate given that I can find no specific acute or emergent medical condition.  His mild hypoxemia may be secondary to his body habitus and some mild undiagnosed COPD, but his lungs are clear.  He has  had no difficulty breathing and no distress throughout his stay in the emergency department.  He has had 2 negative troponins.  I think that there is benefit in making him sign out [place] because we engaged in shared decision making and he has the capacity to make his own decision.  I gave him strict return precautions to call 911 or return immediately if he develops any new or worsening symptoms.  He and his family understand and agree with the plan.  [CF]    Clinical Course User Index [CF] Hinda Kehr, MD    ____________________________________________  FINAL CLINICAL IMPRESSION(S) / ED DIAGNOSES  Final diagnoses:  Shortness of breath     MEDICATIONS GIVEN DURING THIS VISIT:  Medications  iopamidol (ISOVUE-370) 76 % injection 75 mL (75 mLs Intravenous Contrast Given 09/13/17 0151)     ED Discharge Orders    None       Note:  This document was prepared using Dragon voice recognition  software and may include unintentional dictation errors.    Hinda Kehr, MD 09/13/17 [phone]

## 2017-09-13 NOTE — Discharge Instructions (Signed)
We evaluated you extensively for your shortness of breath tonight in the emergency department (ED) and could find no specific abnormalities although your oxygen level is a little bit low.  However your blood work, EKG, chest x-ray, and even CTA of your chest are all reassuring.  We offered to admit you to the hospital for the lower-than-anticipated oxygen level, but since you are feeling fine now you prefer to go home.  Please follow-up with your doctor at the next available opportunity and return immediately if you develop any new or worsening symptoms.

## 2017-09-13 NOTE — ED Notes (Signed)
Pt walked without fail and destated to 88% o2 during a portion of his walk

## 2019-10-31 ENCOUNTER — Other Ambulatory Visit: Payer: Self-pay | Admitting: Surgery

## 2019-11-03 ENCOUNTER — Other Ambulatory Visit: Payer: BLUE CROSS/BLUE SHIELD

## 2019-11-07 ENCOUNTER — Encounter
Admission: RE | Admit: 2019-11-07 | Discharge: 2019-11-07 | Disposition: A | Discharge status: Home / Self Care | Payer: 59 | Source: Ambulatory Visit | Attending: Surgery | Admitting: Surgery

## 2019-11-07 ENCOUNTER — Other Ambulatory Visit: Payer: Self-pay

## 2019-11-07 DIAGNOSIS — Z01818 Encounter for other preprocedural examination: Secondary | ICD-10-CM | POA: Insufficient documentation

## 2019-11-07 DIAGNOSIS — Z20822 Contact with and (suspected) exposure to covid-19: Secondary | ICD-10-CM | POA: Insufficient documentation

## 2019-11-07 HISTORY — DX: Gastro-esophageal reflux disease without esophagitis: K21.9

## 2019-11-07 HISTORY — DX: Unspecified osteoarthritis, unspecified site: M19.90

## 2019-11-07 NOTE — Patient Instructions (Signed)
Your procedure is scheduled on: 11-14-19 TUESDAY Report to Same Day Surgery 2nd floor medical mall [facility] Entrance-take elevator on left to 2nd floor.  Check in with surgery information desk.) To find out your arrival time please call [phone] between 1PM - 3PM on 11-13-19 MONDAY  Remember: Instructions that are not followed completely may result in serious medical risk, up to and including death, or upon the discretion of your surgeon and anesthesiologist your surgery may need to be rescheduled.    _x___ 1. Do not eat food after midnight the night before your procedure. NO GUM OR CANDY AFTER MIDNIGHT. You may drink clear liquids up to 2 hours before you are scheduled to arrive at the hospital for your procedure.  Do not drink clear liquids within 2 hours of your scheduled arrival to the hospital.  Clear liquids include  --Water or Apple juice without pulp  --Gatorade  --Black Coffee or Clear Tea (No milk, no creamers, do not add anything to the coffee or Tea   ____Ensure clear carbohydrate drink on the way to the hospital for bariatric patients  _X___Ensure clear carbohydrate drink-FINISH DRINK 2 HOURS PRIOR TO [place]    __x__ 2. No Alcohol for 24 hours before or after surgery.   __x__3. No Smoking or e-cigarettes for 24 prior to surgery.  Do not use any chewable tobacco products for at least 6 hour prior to surgery   ____  4. Bring all medications with you on the day of surgery if instructed.    __x__ 5. Notify your doctor if there is any change in your medical condition     (cold, fever, infections).    x___6. On the morning of surgery brush your teeth with toothpaste and water.  You may rinse your mouth with mouth wash if you wish.  Do not swallow any toothpaste or mouthwash.   Do not wear jewelry, make-up, hairpins, clips or nail polish.  Do not wear lotions, powders, or perfumes.   Do not shave 48 hours prior to surgery. Men may shave face and  neck.  Do not bring valuables to the hospital.    [facility] is not responsible for any belongings or valuables.               Contacts, dentures or bridgework may not be worn into surgery.  Leave your suitcase in the car. After surgery it may be brought to your room.  For patients admitted to the hospital, discharge time is determined by your treatment team.  _  Patients discharged the day of surgery will not be allowed to drive home.  You will need someone to drive you home and stay with you the night of your procedure.    Please read over the following fact sheets that you were given:   [facility] Preparing for Surgery and MRSA Information/INCENTIVE SPIROMETER INSTRUCTIONS   ____ TAKE THE FOLLOWING MEDICATION THE MORNING OF SURGERY WITH A SMALL SIP OF WATER. These include:  1. NONE  2.  3.  4.  5.  6.  ____Fleets enema or Magnesium Citrate as directed.   _x___ Use CHG Soap or sage wipes as directed on instruction sheet   ____ Use inhalers on the day of surgery and bring to hospital day of surgery  ____ Stop Metformin and Janumet 2 days prior to surgery.    ____ Take 1/2 of usual insulin dose the night before surgery and none on the morning surgery.  ____ Follow recommendations from Cardiologist, Pulmonologist or PCP regarding stopping Aspirin, Coumadin, Plavix ,Eliquis, Effient, or Pradaxa, and Pletal.  X____Stop Anti-inflammatories such as Advil, Aleve, Ibuprofen, Motrin, Naproxen, Naprosyn, Goodies powders or aspirin products NOW-OK to take Tylenol    ____ Stop supplements until after surgery.   ____ Bring C-Pap to the hospital.

## 2019-11-10 ENCOUNTER — Other Ambulatory Visit: Payer: 59

## 2019-11-10 ENCOUNTER — Encounter
Admission: RE | Admit: 2019-11-10 | Discharge: 2019-11-10 | Disposition: A | Discharge status: Home / Self Care | Payer: 59 | Source: Ambulatory Visit | Attending: Surgery | Admitting: Surgery

## 2019-11-10 ENCOUNTER — Other Ambulatory Visit: Payer: Self-pay

## 2019-11-10 DIAGNOSIS — Z01818 Encounter for other preprocedural examination: Secondary | ICD-10-CM | POA: Diagnosis not present

## 2019-11-10 LAB — URINALYSIS, ROUTINE W REFLEX MICROSCOPIC
Bilirubin Urine: NEGATIVE
Glucose, UA: NEGATIVE mg/dL
Hgb urine dipstick: NEGATIVE
Ketones, ur: NEGATIVE mg/dL
Leukocytes,Ua: NEGATIVE
Nitrite: NEGATIVE
Protein, ur: NEGATIVE mg/dL
Specific Gravity, Urine: 1.011 (ref 1.005–1.030)
pH: 5 (ref 5.0–8.0)

## 2019-11-10 LAB — CBC WITH DIFFERENTIAL/PLATELET
Abs Immature Granulocytes: 0.02 10*3/uL (ref 0.00–0.07)
Basophils Absolute: 0 10*3/uL (ref 0.0–0.1)
Basophils Relative: 1 %
Eosinophils Absolute: 0.3 10*3/uL (ref 0.0–0.5)
Eosinophils Relative: 5 %
HCT: 44.3 % (ref 39.0–52.0)
Hemoglobin: 14.8 g/dL (ref 13.0–17.0)
Immature Granulocytes: 0 %
Lymphocytes Relative: 24 %
Lymphs Abs: 1.2 10*3/uL (ref 0.7–4.0)
MCH: 31 pg (ref 26.0–34.0)
MCHC: 33.4 g/dL (ref 30.0–36.0)
MCV: 92.7 fL (ref 80.0–100.0)
Monocytes Absolute: 0.5 10*3/uL (ref 0.1–1.0)
Monocytes Relative: 11 %
Neutro Abs: 2.9 10*3/uL (ref 1.7–7.7)
Neutrophils Relative %: 59 %
Platelets: 243 10*3/uL (ref 150–400)
RBC: 4.78 MIL/uL (ref 4.22–5.81)
RDW: 12.9 % (ref 11.5–15.5)
WBC: 5 10*3/uL (ref 4.0–10.5)
nRBC: 0 % (ref 0.0–0.2)

## 2019-11-10 LAB — COMPREHENSIVE METABOLIC PANEL
ALT: 31 U/L (ref 0–44)
AST: 24 U/L (ref 15–41)
Albumin: 3.9 g/dL (ref 3.5–5.0)
Alkaline Phosphatase: 48 U/L (ref 38–126)
Anion gap: 10 (ref 5–15)
BUN: 17 mg/dL (ref 8–23)
CO2: 28 mmol/L (ref 22–32)
Calcium: 8.9 mg/dL (ref 8.9–10.3)
Chloride: 100 mmol/L (ref 98–111)
Creatinine, Ser: 0.78 mg/dL (ref 0.61–1.24)
GFR calc Af Amer: >60 mL/min (ref >60)
GFR calc non Af Amer: >60 mL/min (ref >60)
Glucose, Bld: 110 mg/dL — ABNORMAL HIGH (ref 70–99)
Potassium: 3.8 mmol/L (ref 3.5–5.1)
Sodium: 138 mmol/L (ref 135–145)
Total Bilirubin: 0.8 mg/dL (ref 0.3–1.2)
Total Protein: 7.4 g/dL (ref 6.5–8.1)

## 2019-11-10 LAB — SARS CORONAVIRUS 2 (TAT 6-24 HRS): SARS Coronavirus 2: NEGATIVE

## 2019-11-10 LAB — TYPE AND SCREEN
ABO/RH(D): AB POS
Antibody Screen: NEGATIVE

## 2019-11-10 LAB — SURGICAL PCR SCREEN
MRSA, PCR: NEGATIVE
Staphylococcus aureus: POSITIVE — AB

## 2019-11-10 NOTE — Pre-Procedure Instructions (Signed)
Pre-Admit Testing Provider Notification Note  Provider Notified: Dr. Poggi  Notification Mode: Fax  Reason: Abnormal lab result.  Response: Fax confirmation received.   Additional Information: Placed on chart. Noted on Pre-Admit Worksheet.  Signed: Zeeva Courser, RN  

## 2019-11-13 MED ORDER — DEXTROSE 5 % IV SOLN
3.0000 g | INTRAVENOUS | Status: AC
  Administered 2019-11-14: 3 g via INTRAVENOUS
  Filled 2019-11-13: qty 3000

## 2019-11-14 ENCOUNTER — Encounter: Payer: Self-pay | Admitting: Surgery

## 2019-11-14 ENCOUNTER — Inpatient Hospital Stay
Admission: RE | Admit: 2019-11-14 | Discharge: 2019-11-16 | DRG: 470 | Disposition: A | Discharge status: Home Health | Payer: 59 | Attending: Surgery | Admitting: Surgery

## 2019-11-14 ENCOUNTER — Inpatient Hospital Stay: Payer: 59

## 2019-11-14 ENCOUNTER — Other Ambulatory Visit: Payer: Self-pay

## 2019-11-14 ENCOUNTER — Encounter
Admission: RE | Disposition: A | Discharge status: Home Health | Payer: Self-pay | Source: Home / Self Care | Attending: Surgery

## 2019-11-14 DIAGNOSIS — E7800 Pure hypercholesterolemia, unspecified: Secondary | ICD-10-CM | POA: Diagnosis present

## 2019-11-14 DIAGNOSIS — E1142 Type 2 diabetes mellitus with diabetic polyneuropathy: Secondary | ICD-10-CM | POA: Diagnosis present

## 2019-11-14 DIAGNOSIS — Z79899 Other long term (current) drug therapy: Secondary | ICD-10-CM | POA: Diagnosis not present

## 2019-11-14 DIAGNOSIS — Z96642 Presence of left artificial hip joint: Secondary | ICD-10-CM

## 2019-11-14 DIAGNOSIS — I10 Essential (primary) hypertension: Secondary | ICD-10-CM | POA: Diagnosis present

## 2019-11-14 DIAGNOSIS — E785 Hyperlipidemia, unspecified: Secondary | ICD-10-CM | POA: Diagnosis present

## 2019-11-14 DIAGNOSIS — Z8551 Personal history of malignant neoplasm of bladder: Secondary | ICD-10-CM | POA: Diagnosis not present

## 2019-11-14 DIAGNOSIS — K219 Gastro-esophageal reflux disease without esophagitis: Secondary | ICD-10-CM | POA: Diagnosis present

## 2019-11-14 DIAGNOSIS — Z6839 Body mass index (BMI) 39.0-39.9, adult: Secondary | ICD-10-CM | POA: Diagnosis not present

## 2019-11-14 DIAGNOSIS — M1612 Unilateral primary osteoarthritis, left hip: Secondary | ICD-10-CM | POA: Diagnosis present

## 2019-11-14 HISTORY — PX: TOTAL HIP ARTHROPLASTY: SHX124

## 2019-11-14 LAB — ABO/RH: ABO/RH(D): AB POS

## 2019-11-14 SURGERY — ARTHROPLASTY, HIP, TOTAL,POSTERIOR APPROACH
Anesthesia: Spinal | Site: Hip | Laterality: Left

## 2019-11-14 MED ORDER — PROPOFOL 500 MG/50ML IV EMUL
INTRAVENOUS | Status: DC | PRN
  Administered 2019-11-14: 100 ug/kg/min via INTRAVENOUS

## 2019-11-14 MED ORDER — OXYCODONE HCL 5 MG PO TABS
5.0000 mg | ORAL_TABLET | ORAL | Status: DC | PRN
  Administered 2019-11-14: 19:00:00 10 mg via ORAL
  Administered 2019-11-14: 5 mg via ORAL
  Administered 2019-11-15 (×3): 10 mg via ORAL
  Filled 2019-11-14: qty 1
  Filled 2019-11-14 (×4): qty 2

## 2019-11-14 MED ORDER — BUPIVACAINE-EPINEPHRINE (PF) 0.5% -1:200000 IJ SOLN
INTRAMUSCULAR | Status: DC | PRN
  Administered 2019-11-14: 30 mL

## 2019-11-14 MED ORDER — FLEET ENEMA 7-19 GM/118ML RE ENEM: 1.0000 | ENEMA | Freq: Once | RECTAL | Status: DC | PRN

## 2019-11-14 MED ORDER — FLUTICASONE PROPIONATE 50 MCG/ACT NA SUSP
2.0000 | Freq: Every day | NASAL | Status: DC | PRN
  Filled 2019-11-14: qty 16

## 2019-11-14 MED ORDER — IRBESARTAN 150 MG PO TABS
150.0000 mg | ORAL_TABLET | Freq: Every day | ORAL | Status: DC
  Administered 2019-11-15 – 2019-11-16 (×2): 150 mg via ORAL
  Filled 2019-11-14 (×2): qty 1

## 2019-11-14 MED ORDER — OXYCODONE HCL 5 MG/5ML PO SOLN: 5.0000 mg | Freq: Once | ORAL | Status: DC | PRN

## 2019-11-14 MED ORDER — DOCUSATE SODIUM 100 MG PO CAPS
100.0000 mg | ORAL_CAPSULE | Freq: Two times a day (BID) | ORAL | Status: DC
  Administered 2019-11-14 – 2019-11-16 (×4): 100 mg via ORAL
  Filled 2019-11-14 (×4): qty 1

## 2019-11-14 MED ORDER — ONDANSETRON HCL 4 MG PO TABS: 4.0000 mg | ORAL_TABLET | Freq: Four times a day (QID) | ORAL | Status: DC | PRN

## 2019-11-14 MED ORDER — TRAMADOL HCL 50 MG PO TABS
50.0000 mg | ORAL_TABLET | Freq: Four times a day (QID) | ORAL | Status: DC | PRN
  Administered 2019-11-16: 50 mg via ORAL
  Filled 2019-11-14: qty 1

## 2019-11-14 MED ORDER — HYDROMORPHONE HCL 1 MG/ML IJ SOLN
0.5000 mg | INTRAMUSCULAR | Status: DC | PRN
  Administered 2019-11-14 – 2019-11-15 (×4): 0.5 mg via INTRAVENOUS
  Filled 2019-11-14 (×4): qty 1

## 2019-11-14 MED ORDER — PROPOFOL 500 MG/50ML IV EMUL
INTRAVENOUS | Status: AC
  Filled 2019-11-14: qty 50

## 2019-11-14 MED ORDER — BUPIVACAINE HCL (PF) 0.5 % IJ SOLN
INTRAMUSCULAR | Status: DC | PRN
  Administered 2019-11-14: 3 mL via INTRATHECAL

## 2019-11-14 MED ORDER — FENTANYL CITRATE (PF) 100 MCG/2ML IJ SOLN
25.0000 ug | INTRAMUSCULAR | Status: DC | PRN
  Administered 2019-11-14: 50 ug via INTRAVENOUS

## 2019-11-14 MED ORDER — HYDROCHLOROTHIAZIDE 12.5 MG PO CAPS
12.5000 mg | ORAL_CAPSULE | Freq: Every day | ORAL | Status: DC
  Administered 2019-11-15 – 2019-11-16 (×2): 12.5 mg via ORAL
  Filled 2019-11-14 (×2): qty 1

## 2019-11-14 MED ORDER — MIDAZOLAM HCL 2 MG/2ML IJ SOLN
INTRAMUSCULAR | Status: AC
  Filled 2019-11-14: qty 2

## 2019-11-14 MED ORDER — BUPIVACAINE HCL (PF) 0.5 % IJ SOLN
INTRAMUSCULAR | Status: AC
  Filled 2019-11-14: qty 10

## 2019-11-14 MED ORDER — PROPOFOL 10 MG/ML IV BOLUS
INTRAVENOUS | Status: AC
  Filled 2019-11-14: qty 20

## 2019-11-14 MED ORDER — PHENYLEPHRINE HCL (PRESSORS) 10 MG/ML IV SOLN
INTRAVENOUS | Status: AC
  Filled 2019-11-14: qty 1

## 2019-11-14 MED ORDER — FENTANYL CITRATE (PF) 100 MCG/2ML IJ SOLN
INTRAMUSCULAR | Status: DC | PRN
  Administered 2019-11-14 (×2): 50 ug via INTRAVENOUS

## 2019-11-14 MED ORDER — ATORVASTATIN CALCIUM 20 MG PO TABS
20.0000 mg | ORAL_TABLET | Freq: Every day | ORAL | Status: DC
  Administered 2019-11-14 – 2019-11-15 (×2): 20 mg via ORAL
  Filled 2019-11-14 (×2): qty 1

## 2019-11-14 MED ORDER — ONDANSETRON HCL 4 MG/2ML IJ SOLN: 4.0000 mg | Freq: Four times a day (QID) | INTRAMUSCULAR | Status: DC | PRN

## 2019-11-14 MED ORDER — MIDAZOLAM HCL 5 MG/5ML IJ SOLN
INTRAMUSCULAR | Status: DC | PRN
  Administered 2019-11-14 (×2): 2 mg via INTRAVENOUS

## 2019-11-14 MED ORDER — FENTANYL CITRATE (PF) 100 MCG/2ML IJ SOLN
INTRAMUSCULAR | Status: AC
  Filled 2019-11-14: qty 2

## 2019-11-14 MED ORDER — OXYMETAZOLINE HCL 0.05 % NA SOLN
NASAL | Status: AC
  Filled 2019-11-14: qty 30

## 2019-11-14 MED ORDER — OLMESARTAN MEDOXOMIL-HCTZ 20-12.5 MG PO TABS: 1.0000 | ORAL_TABLET | ORAL | Status: DC

## 2019-11-14 MED ORDER — ONDANSETRON HCL 4 MG/2ML IJ SOLN
INTRAMUSCULAR | Status: DC | PRN
  Administered 2019-11-14: 4 mg via INTRAVENOUS

## 2019-11-14 MED ORDER — ACETAMINOPHEN 500 MG PO TABS
1000.0000 mg | ORAL_TABLET | Freq: Four times a day (QID) | ORAL | Status: AC
  Administered 2019-11-14 – 2019-11-15 (×4): 1000 mg via ORAL
  Filled 2019-11-14 (×4): qty 2

## 2019-11-14 MED ORDER — SODIUM CHLORIDE 0.9 % IV SOLN
INTRAVENOUS | Status: DC | PRN
  Administered 2019-11-14: 60 mL

## 2019-11-14 MED ORDER — DEXMEDETOMIDINE HCL 200 MCG/2ML IV SOLN
INTRAVENOUS | Status: DC | PRN
  Administered 2019-11-14 (×2): 8 ug via INTRAVENOUS
  Administered 2019-11-14 (×3): 4 ug via INTRAVENOUS

## 2019-11-14 MED ORDER — PROPOFOL 10 MG/ML IV BOLUS
INTRAVENOUS | Status: DC | PRN
  Administered 2019-11-14 (×5): 20 mg via INTRAVENOUS

## 2019-11-14 MED ORDER — METOCLOPRAMIDE HCL 10 MG PO TABS: 5.0000 mg | ORAL_TABLET | Freq: Three times a day (TID) | ORAL | Status: DC | PRN

## 2019-11-14 MED ORDER — SODIUM CHLORIDE 0.9 % IV SOLN: INTRAVENOUS | Status: DC

## 2019-11-14 MED ORDER — MAGNESIUM HYDROXIDE 400 MG/5ML PO SUSP
30.0000 mL | Freq: Every day | ORAL | Status: DC | PRN
  Administered 2019-11-15: 30 mL via ORAL
  Filled 2019-11-14: qty 30

## 2019-11-14 MED ORDER — OXYCODONE HCL 5 MG PO TABS: 5.0000 mg | ORAL_TABLET | Freq: Once | ORAL | Status: DC | PRN

## 2019-11-14 MED ORDER — METOCLOPRAMIDE HCL 5 MG/ML IJ SOLN: 5.0000 mg | Freq: Three times a day (TID) | INTRAMUSCULAR | Status: DC | PRN

## 2019-11-14 MED ORDER — KETOROLAC TROMETHAMINE 15 MG/ML IJ SOLN
15.0000 mg | Freq: Four times a day (QID) | INTRAMUSCULAR | Status: AC
  Administered 2019-11-14 – 2019-11-15 (×4): 15 mg via INTRAVENOUS
  Filled 2019-11-14 (×4): qty 1

## 2019-11-14 MED ORDER — BISACODYL 10 MG RE SUPP: 10.0000 mg | Freq: Every day | RECTAL | Status: DC | PRN

## 2019-11-14 MED ORDER — ACETAMINOPHEN 325 MG PO TABS: 325.0000 mg | ORAL_TABLET | Freq: Four times a day (QID) | ORAL | Status: DC | PRN

## 2019-11-14 MED ORDER — KETOROLAC TROMETHAMINE 30 MG/ML IJ SOLN
INTRAMUSCULAR | Status: AC
  Filled 2019-11-14: qty 1

## 2019-11-14 MED ORDER — LACTATED RINGERS IV SOLN: INTRAVENOUS | Status: DC

## 2019-11-14 MED ORDER — FAMOTIDINE 20 MG PO TABS
ORAL_TABLET | ORAL | Status: AC
  Filled 2019-11-14: qty 1

## 2019-11-14 MED ORDER — DIPHENHYDRAMINE HCL 12.5 MG/5ML PO ELIX: 12.5000 mg | ORAL_SOLUTION | ORAL | Status: DC | PRN

## 2019-11-14 MED ORDER — TRANEXAMIC ACID 1000 MG/10ML IV SOLN
INTRAVENOUS | Status: DC | PRN
  Administered 2019-11-14: 1000 mg via TOPICAL

## 2019-11-14 MED ORDER — DEXTROSE 5 % IV SOLN
3.0000 g | Freq: Four times a day (QID) | INTRAVENOUS | Status: AC
  Administered 2019-11-14 – 2019-11-15 (×3): 3 g via INTRAVENOUS
  Filled 2019-11-14 (×3): qty 3

## 2019-11-14 MED ORDER — LIDOCAINE HCL (PF) 2 % IJ SOLN
INTRAMUSCULAR | Status: AC
  Filled 2019-11-14: qty 10

## 2019-11-14 MED ORDER — CLOBETASOL PROPIONATE 0.05 % EX OINT
1.0000 "application " | TOPICAL_OINTMENT | Freq: Every day | CUTANEOUS | Status: DC | PRN
  Filled 2019-11-14: qty 15

## 2019-11-14 MED ORDER — ONDANSETRON HCL 4 MG/2ML IJ SOLN
INTRAMUSCULAR | Status: AC
  Filled 2019-11-14: qty 2

## 2019-11-14 MED ORDER — ENOXAPARIN SODIUM 40 MG/0.4ML ~~LOC~~ SOLN
40.0000 mg | SUBCUTANEOUS | Status: DC
  Administered 2019-11-15 – 2019-11-16 (×2): 40 mg via SUBCUTANEOUS
  Filled 2019-11-14 (×2): qty 0.4

## 2019-11-14 MED ORDER — KETOROLAC TROMETHAMINE 30 MG/ML IJ SOLN
30.0000 mg | Freq: Once | INTRAMUSCULAR | Status: AC
  Administered 2019-11-14: 30 mg via INTRAVENOUS

## 2019-11-14 MED ORDER — FAMOTIDINE 20 MG PO TABS
20.0000 mg | ORAL_TABLET | Freq: Once | ORAL | Status: AC
  Administered 2019-11-14: 07:00:00 20 mg via ORAL

## 2019-11-14 SURGICAL SUPPLY — 52 items
BLADE SAGITTAL WIDE XTHICK NO (BLADE) ×2 IMPLANT
BLADE SURG SZ20 CARB STEEL (BLADE) ×2 IMPLANT
CANISTER SUCT 1200ML W/VALVE (MISCELLANEOUS) ×2 IMPLANT
CANISTER SUCT 3000ML PPV (MISCELLANEOUS) ×4 IMPLANT
CHLORAPREP W/TINT 26 (MISCELLANEOUS) ×4 IMPLANT
COVER WAND RF STERILE (DRAPES) ×2 IMPLANT
DRAPE 3/4 80X56 (DRAPES) ×2 IMPLANT
DRAPE INCISE IOBAN 66X60 STRL (DRAPES) ×2 IMPLANT
DRAPE SURG 17X11 SM STRL (DRAPES) ×4 IMPLANT
DRSG OPSITE POSTOP 4X10 (GAUZE/BANDAGES/DRESSINGS) ×2 IMPLANT
ELECT BLADE 6.5 EXT (BLADE) ×2 IMPLANT
ELECT CAUTERY BLADE 6.4 (BLADE) ×2 IMPLANT
GLOVE BIO SURGEON STRL SZ7.5 (GLOVE) ×8 IMPLANT
GLOVE BIO SURGEON STRL SZ8 (GLOVE) ×8 IMPLANT
GLOVE BIOGEL PI IND STRL 8 (GLOVE) ×1 IMPLANT
GLOVE BIOGEL PI INDICATOR 8 (GLOVE) ×1
GLOVE INDICATOR 8.0 STRL GRN (GLOVE) ×2 IMPLANT
GOWN STRL REUS W/ TWL LRG LVL3 (GOWN DISPOSABLE) ×1 IMPLANT
GOWN STRL REUS W/ TWL XL LVL3 (GOWN DISPOSABLE) ×1 IMPLANT
GOWN STRL REUS W/TWL LRG LVL3 (GOWN DISPOSABLE) ×1
GOWN STRL REUS W/TWL XL LVL3 (GOWN DISPOSABLE) ×1
HEAD CERAMIC BIOLOX 36 T1 +3 (Head) ×2 IMPLANT
HOOD PEEL AWAY FLYTE STAYCOOL (MISCELLANEOUS) ×6 IMPLANT
KIT TURNOVER KIT A (KITS) ×2 IMPLANT
LINER ACE G7 36 HIGH WALL (Liner) ×2 IMPLANT
NDL SAFETY ECLIPSE 18X1.5 (NEEDLE) ×2 IMPLANT
NEEDLE FILTER BLUNT 18X 1/2SAF (NEEDLE) ×1
NEEDLE FILTER BLUNT 18X1 1/2 (NEEDLE) ×1 IMPLANT
NEEDLE HYPO 18GX1.5 SHARP (NEEDLE) ×2
NEEDLE SPNL 20GX3.5 QUINCKE YW (NEEDLE) ×2 IMPLANT
PACK HIP PROSTHESIS (MISCELLANEOUS) ×2 IMPLANT
PIN STEINMAN 3/16 (PIN) ×2 IMPLANT
PIN STEINMANN 3/16X9 BAY 6PK (Pin) ×1 IMPLANT
PULSAVAC PLUS IRRIG FAN TIP (DISPOSABLE) ×2
SHELL ACETABULAR 3H G7 (Shell) ×2 IMPLANT
SOL .9 NS 3000ML IRR  AL (IV SOLUTION) ×1
SOL .9 NS 3000ML IRR UROMATIC (IV SOLUTION) ×1 IMPLANT
SPONGE LAP 18X18 RF (DISPOSABLE) ×2 IMPLANT
ST PIN 3/16X9 BAY 6PK (Pin) ×2 IMPLANT
STAPLER SKIN PROX 35W (STAPLE) ×2 IMPLANT
STEM COLLARLESS FULL 13X145MM (Stem) ×2 IMPLANT
SUT TICRON 2-0 30IN 311381 (SUTURE) ×4 IMPLANT
SUT VIC AB 0 CT1 36 (SUTURE) ×2 IMPLANT
SUT VIC AB 1 CT1 36 (SUTURE) ×2 IMPLANT
SUT VIC AB 2-0 CT1 (SUTURE) ×8 IMPLANT
SYR 10ML LL (SYRINGE) ×2 IMPLANT
SYR 20ML LL LF (SYRINGE) ×2 IMPLANT
SYR 30ML LL (SYRINGE) ×4 IMPLANT
TAPE TRANSPORE STRL 2 31045 (GAUZE/BANDAGES/DRESSINGS) ×2 IMPLANT
TIP FAN IRRIG PULSAVAC PLUS (DISPOSABLE) ×1 IMPLANT
TRAY FOLEY MTR SLVR 16FR STAT (SET/KITS/TRAYS/PACK) IMPLANT
WATER STERILE IRR 1000ML POUR (IV SOLUTION) ×2 IMPLANT

## 2019-11-14 NOTE — Evaluation (Signed)
Physical Therapy Evaluation Patient Details Name: Patrick Mason MRN: [id number] DOB: 1955/08/27 Today's Date: 11/14/2019   History of Present Illness  Pt admitted for L THR. HIstory includes HTN, GERD, DM, and obesity  Clinical Impression  Pt is a pleasant 64 year old male who was admitted for L THR-post approach. Pt performs bed mobility, transfers, and ambulation with min assist and RW. Educated on hip precautions. Good endurance with there-ex, although pain limited. Pt demonstrates deficits with strength/pain/mobility. Would benefit from skilled PT to address above deficits and promote optimal return to PLOF. Recommend transition to [place] upon discharge from acute hospitalization.     Follow Up Recommendations Home health PT    Equipment Recommendations  Rolling walker with 5" wheels    Recommendations for Other Services       Precautions / Restrictions Precautions Precautions: Fall;Posterior Hip Precaution Booklet Issued: No Restrictions Weight Bearing Restrictions: Yes LLE Weight Bearing: Weight bearing as tolerated      Mobility  Bed Mobility Overal bed mobility: Needs Assistance Bed Mobility: Supine to Sit     Supine to sit: Min assist     General bed mobility comments: safe technique with cues for sequencing.  Transfers Overall transfer level: Needs assistance Equipment used: Rolling walker (2 wheeled) Transfers: Sit to/from Stand Sit to Stand: Min assist         General transfer comment: safe technique with cues for sequencing. Once standing, good WBing noted on surgical leg  Ambulation/Gait Ambulation/Gait assistance: Min assist Gait Distance (Feet): 5 Feet Assistive device: Rolling walker (2 wheeled) Gait Pattern/deviations: Step-to pattern     General Gait Details: pain limited. Needs cues for sequencing to maintain hip precautions.  Stairs            Wheelchair Mobility    Modified Rankin (Stroke Patients Only)       Balance  Overall balance assessment: Needs assistance Sitting-balance support: Feet supported Sitting balance-Leahy Scale: Good     Standing balance support: Bilateral upper extremity supported Standing balance-Leahy Scale: Fair                               Pertinent Vitals/Pain Pain Assessment: 0-10 Pain Score: 9  Pain Location: L hip Pain Descriptors / Indicators: Operative site guarding Pain Intervention(s): Limited activity within patient's tolerance;Ice applied;Repositioned    Home Living Family/patient expects to be discharged to:: Private residence Living Arrangements: Spouse/significant other Available Help at Discharge: Family Type of Home: House Home Access: Stairs to enter Entrance Stairs-Rails: Right Entrance Stairs-Number of Steps: 3 Home Layout: One level Home Equipment: None      Prior Function Level of Independence: Independent         Comments: limited distance secondary to pain     Hand Dominance        Extremity/Trunk Assessment   Upper Extremity Assessment Upper Extremity Assessment: Overall WFL for tasks assessed    Lower Extremity Assessment Lower Extremity Assessment: Generalized weakness(L LE grossly 3/5)       Communication   Communication: No difficulties  Cognition Arousal/Alertness: Awake/alert Behavior During Therapy: WFL for tasks assessed/performed Overall Cognitive Status: Within Functional Limits for tasks assessed                                        General Comments      Exercises Other  Exercises Other Exercises: educated on hip precautions. Supine ther-ex performed x 10 reps with AP, quad sets, glut sets, and hip abd/add. min assist needed. Tends to hold breath during ther-ex   Assessment/Plan    PT Assessment Patient needs continued PT services  PT Problem List Decreased strength;Decreased activity tolerance;Decreased balance;Decreased mobility;Decreased knowledge of use of  DME;Pain;Decreased knowledge of precautions       PT Treatment Interventions DME instruction;Gait training;Stair training;Therapeutic exercise;Balance training    PT Goals (Current goals can be found in the Care Plan section)  Acute Rehab PT Goals Patient Stated Goal: to go home PT Goal Formulation: With patient Time For Goal Achievement: 11/28/19 Potential to Achieve Goals: Good    Frequency BID   Barriers to discharge        Co-evaluation               AM-PAC PT "6 Clicks" Mobility  Outcome Measure Help needed turning from your back to your side while in a flat bed without using bedrails?: A Little Help needed moving from lying on your back to sitting on the side of a flat bed without using bedrails?: A Little Help needed moving to and from a bed to a chair (including a wheelchair)?: A Little Help needed standing up from a chair using your arms (e.g., wheelchair or bedside chair)?: A Little Help needed to walk in hospital room?: A Little Help needed climbing 3-5 steps with a railing? : A Little 6 Click Score: 18    End of Session Equipment Utilized During Treatment: Gait belt Activity Tolerance: Patient tolerated treatment well Patient left: in chair;with chair alarm set;with SCD's reapplied Nurse Communication: Mobility status PT Visit Diagnosis: Muscle weakness (generalized) (M62.81);Difficulty in walking, not elsewhere classified (R26.2);Pain Pain - Right/Left: Left Pain - part of body: Hip    Time: [id number] PT Time Calculation (min) (ACUTE ONLY): 38 min   Charges:   PT Evaluation $PT Eval Moderate Complexity: 1 Mod PT Treatments $Therapeutic Exercise: 23-37 mins        Greggory Stallion, PT, DPT [phone]   Erendira Crabtree 11/14/2019, 3:46 PM

## 2019-11-14 NOTE — H&P (Signed)
Paper H&P to be scanned into permanent record. H&P reviewed and patient re-examined. No changes. 

## 2019-11-14 NOTE — Anesthesia Postprocedure Evaluation (Signed)
Anesthesia Post Note  Patient: Patrick Mason  Procedure(s) Performed: TOTAL HIP ARTHROPLASTY (Left Hip)  Patient location during evaluation: PACU Anesthesia Type: Spinal Level of consciousness: awake and alert Pain management: pain level controlled Vital Signs Assessment: post-procedure vital signs reviewed and stable Respiratory status: spontaneous breathing, nonlabored ventilation, respiratory function stable and patient connected to nasal cannula oxygen Cardiovascular status: blood pressure returned to baseline and stable Postop Assessment: no apparent nausea or vomiting Anesthetic complications: no     Last Vitals:  Vitals:   11/14/19 1032 11/14/19 1034  BP: 117/76   Pulse:    Resp:    Temp:    SpO2: 94% 95%    Last Pain:  Vitals:   11/14/19 1043  TempSrc:   PainSc: 3                  Molli Barrows

## 2019-11-14 NOTE — Transfer of Care (Signed)
Immediate Anesthesia Transfer of Care Note  Patient: Patrick Mason  Procedure(s) Performed: TOTAL HIP ARTHROPLASTY (Left Hip)  Patient Location: PACU  Anesthesia Type:Spinal  Level of Consciousness: awake, alert  and oriented  Airway & Oxygen Therapy: Patient Spontanous Breathing and Patient connected to face mask oxygen  Post-op Assessment: Report given to RN and Post -op Vital signs reviewed and stable  Post vital signs: Reviewed  Last Vitals:  Vitals Value Taken Time  BP 124/66 11/14/19 1002  Temp    Pulse 103 11/14/19 1002  Resp 15 11/14/19 1002  SpO2 92 % 11/14/19 1002  Vitals shown include unvalidated device data.  Last Pain:  Vitals:   11/14/19 0615  TempSrc: Temporal  PainSc: 5          Complications: No apparent anesthesia complications

## 2019-11-14 NOTE — Op Note (Signed)
11/14/2019  10:11 AM  Patient:   Patrick Mason  Pre-Op Diagnosis:   Advanced degenerative joint disease, left hip.  Post-Op Diagnosis:   Same.  Procedure:   Left total hip arthroplasty.  Surgeon:   Pascal Lux, MD  Assistant:   Cameron Proud, PA-C  Anesthesia:   Spinal  Findings:   As above.  Complications:   None  EBL:   400 cc  Fluids:   700 cc crystalloid  UOP:   None  TT:   None  Drains:   None  Closure:   Staples  Implants:   Biomet press-fit system with a #13 laterally offset Echo femoral stem, a 52 mm acetabular shell with an E-poly hi-wall liner, and a 36 mm ceramic head with a +3 mm neck.  Brief Clinical Note:   The patient is a 64 year old male with a long history of progressively worsening left hip pain. His symptoms have progressed despite medications, activity modification, etc. His history and examination are consistent with advanced degenerative joint disease, confirmed by plain radiographs. He presents at this time for a left total hip arthroplasty.   Procedure:   The patient was brought into the operating room. After adequate spinal anesthesia was obtained, the patient was repositioned in the right lateral decubitus position and secured using a lateral hip positioner. The left hip and lower extremity were prepped with ChloroPrep solution before being draped sterilely. Preoperative antibiotics were administered. A timeout was performed to verify the appropriate surgical site before a standard posterior approach to the hip was made through an approximately 4-5 inch incision. The incision was carried down through the subcutaneous tissues to expose the gluteal fascia and proximal end of the iliotibial band. These structures were split the length of the incision and the Charnley self-retaining hip retractor placed. The bursal tissues were swept posteriorly to expose the short external rotators. The anterior border of the piriformis tendon was identified and this  plane developed down through the capsule to enter the joint. A flap of tissue was elevated off the posterior aspect of the femoral neck and greater trochanter and retracted posteriorly. This flap included the piriformis tendon, the short external rotators, and the posterior capsule. The soft tissues were elevated off the lateral aspect of the ilium and a large Steinmann pin placed bicortically. With the left leg aligned over the right, a drill bit was placed into the greater trochanter parallel to the Steinmann pin and the distance between these two pins measured in order to optimize leg lengths postoperatively. The drill bit was removed and the hip dislocated. The piriformis fossa was debrided of soft tissues before the intramedullary canal was accessed through this point using a triple step reamer. The canal was reamed sequentially beginning with a #7 tapered reamer and progressing to a #13 tapered reamer. This provided excellent circumferential chatter. Using the appropriate guide, a femoral neck cut was made 10-12 mm above the lesser trochanter. The femoral head was removed.  Attention was directed to the acetabular side. The labrum was debrided circumferentially before the ligamentum teres was removed using a large curette. A line was drawn on the drapes corresponding to the native version of the acetabulum. This line was used as a guide while the acetabulum was reamed sequentially beginning with a 47 mm reamer and progressing to a 51 mm reamer. This provided excellent circumferential chatter. The 51 mm trial acetabulum was positioned and found to fit quite well. Therefore, the 52 mm acetabular shell was selected  and impacted into place with care taken to maintain the appropriate version. The trial high wall liner was inserted.  Attention was redirected to the femoral side. A box osteotome was used to establish version before the canal was broached sequentially beginning with a #11 broach and progressing to  a #13 broach. This was left in place and several trial reductions performed using both a standard and laterally offset neck options, as well as the +0 mm and +3 mm neck lengths. After removing the trial components, the "manhole cover" was placed into the apex of the acetabular shell and tightened securely. The permanent E-polyethylene hi-wall liner was impacted into the acetabular shell and its locking mechanism verified using a quarter-inch osteotome. Next, the #13 laterally offset femoral stem was impacted into place with care taken to maintain the appropriate version. A repeat trial reduction was performed using the +0 mm and +3 mm neck lengths. The +3 mm neck length demonstrated excellent stability both in extension and external rotation as well as with flexion to 90 and internal rotation beyond 70. It also was stable in the position of sleep. In addition, leg lengths appeared to be restored appropriately, both by reassessing the position of the right leg over the left, as well as by measuring the distance between the Steinmann pin and the drill bit. The 36 mm ceramic head with the +3 mm neck was impacted onto the stem of the femoral component. The Morse taper locking mechanism was verified using manual distraction before the head was relocated and placed through a range of motion with the findings as described above.  The wound was copiously irrigated with sterile saline solution via the jet lavage system before the peri-incisional and pericapsular tissues were injected with 30 cc of 0.5% Sensorcaine with epinephrine and 20 cc of Exparel diluted out to 60 cc with normal saline to help with postoperative analgesia. The posterior flap was reapproximated to the posterior aspect of the greater trochanter using #2 Tycron interrupted sutures placed through drill holes. Several additional #2 Tycron interrupted sutures were used to reinforce this layer of closure. The iliotibial band was reapproximated using #1  Vicryl interrupted sutures before the gluteal fascia was closed using a running #1 Vicryl suture. At this point, 1 g of transexemic acid in 10 cc of normal saline was injected into the joint to help reduce postoperative bleeding. The subcutaneous tissues were closed in several layers using 2-0 Vicryl interrupted sutures before the skin was closed using staples. A sterile occlusive dressing was applied to the wound. The patient was then rolled back into the supine position on his hospital bed before being awakened and returned to the recovery room in satisfactory condition after tolerating the procedure well.

## 2019-11-14 NOTE — Anesthesia Procedure Notes (Signed)
Spinal  Patient location during procedure: OR Start time: 11/14/2019 7:22 AM End time: 11/14/2019 7:26 AM Staffing Anesthesiologist: Piscitello, Precious Haws, MD Resident/CRNA: Rolla Plate, CRNA Other anesthesia staff: Junie Bame, RN Preanesthetic Checklist Completed: patient identified, IV checked, site marked, risks and benefits discussed, surgical consent, monitors and equipment checked, pre-op evaluation and timeout performed Spinal Block Patient position: sitting Prep: Betadine Patient monitoring: heart rate, continuous pulse ox, blood pressure and cardiac monitor Approach: midline Location: L4-5 Injection technique: single-shot Needle Needle type: Whitacre and Introducer  Needle gauge: 25 G Needle length: 12.7 cm Assessment Sensory level: T6 Additional Notes Negative paresthesia. Negative blood return. Positive free-flowing CSF. Expiration date of kit checked and confirmed. Patient tolerated procedure well, without complications.

## 2019-11-14 NOTE — TOC Benefit Eligibility Note (Signed)
Transition of Care [facility]) Benefit Eligibility Note    Patient Details  Name: Patrick Mason MRN: [id number] Date of Birth: 1956/07/10   Medication/Dose: Enoxaparin 40mg  once daily for 14 days  Covered?: Yes  Prescription Coverage [facility]  Spoke with Person/Company/Phone Number:: Aleene Davidson with [facility] [employer] at [phone]  Co-Pay: $10 estimated copay  Prior Approval: No  Deductible: (No deductible)   Dannette Barbara  Phone Number: [phone] or [phone] 11/14/2019, 3:41 PM

## 2019-11-14 NOTE — Plan of Care (Signed)

## 2019-11-14 NOTE — TOC Progression Note (Signed)
Transition of Care [facility]) - Progression Note    Patient Details  Name: Patrick Mason MRN: [id number] Date of Birth: Jul 25, 1955  Transition of Care [facility] CM/SW [place], RN Phone Number: 11/14/2019, 2:16 PM  Clinical Narrative:     Requested the price of Lovenox 40 mg daily, will provide cost once obtained    Expected Discharge Plan and Services                                                 Social Determinants of Health (SDOH) Interventions    Readmission Risk Interventions No flowsheet data found.

## 2019-11-14 NOTE — Anesthesia Preprocedure Evaluation (Signed)
Anesthesia Evaluation  Patient identified by MRN, date of birth, ID band Patient awake    Reviewed: Allergy & Precautions, H&P , NPO status , Patient's Chart, lab work & pertinent test results  History of Anesthesia Complications Negative for: history of anesthetic complications  Airway Mallampati: III  TM Distance: <3 FB Neck ROM: full    Dental  (+) Chipped   Pulmonary neg shortness of breath, former smoker,  Signs and symptoms suggestive of sleep apnea            Cardiovascular Exercise Tolerance: Good hypertension, (-) angina(-) DOE      Neuro/Psych  Neuromuscular disease negative psych ROS   GI/Hepatic Neg liver ROS, GERD  Medicated and Controlled,  Endo/Other  diabetes, Type 2Morbid obesity  Renal/GU      Musculoskeletal   Abdominal   Peds  Hematology negative hematology ROS (+)   Anesthesia Other Findings Past Medical History: No date: Arthritis 2017: 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 No date: Carpal tunnel syndrome No date: Diabetes mellitus without complication ([place])     Comment:  pt denies 05/10/17 No date: GERD (gastroesophageal reflux disease)     Comment:  H/O No date: Hyperlipidemia No date: Hypertension No date: Psoriasis No date: Right rotator cuff tear  Past Surgical History: 05/12/2017: BILATERAL CARPAL TUNNEL RELEASE; Bilateral     Comment:  Procedure: BILATERAL CARPAL TUNNEL RELEASE;  Surgeon:               Leanor Kail, MD;  Location: [facility] ORS;  Service:               Orthopedics;  Laterality: Bilateral; No date: CHOLECYSTECTOMY 04/09/2017: COLONOSCOPY WITH PROPOFOL; N/A     Comment:  Procedure: COLONOSCOPY WITH PROPOFOL;  Surgeon: Manya Silvas, MD;  Location: [facility] ENDOSCOPY;  Service:               Endoscopy;  Laterality: N/A; No date: CYSTOURETHROSCOPY No date: HERNIA REPAIR No date: TRANSURETHRAL RESECTION OF BLADDER TUMOR WITH GYRUS  (TURBT- GYRUS)  BMI    Body Mass Index: 39.87 kg/m      Reproductive/Obstetrics negative OB ROS                             Anesthesia Physical Anesthesia Plan  ASA: III  Anesthesia Plan: Spinal   Post-op Pain Management:    Induction:   PONV Risk Score and Plan:   Airway Management Planned: Natural Airway and Nasal Cannula  Additional Equipment:   Intra-op Plan:   Post-operative Plan:   Informed Consent: I have reviewed the patients History and Physical, chart, labs and discussed the procedure including the risks, benefits and alternatives for the proposed anesthesia with the patient or authorized representative who has indicated his/her understanding and acceptance.     Dental Advisory Given  Plan Discussed with: Anesthesiologist, CRNA and Surgeon  Anesthesia Plan Comments: (Patient reports no bleeding problems and no anticoagulant use.  Plan for spinal with backup GA  Patient consented for risks of anesthesia including but not limited to:  - adverse reactions to medications - damage to eyes, teeth, lips or other oral mucosa - nerve damage due to positioning  - risk of bleeding, infection, nerve damage and headache - risk of failed spinal - damage to teeth, lips or other oral mucosa - sore throat or  hoarseness - damage to heart, brain, nerves, lungs or loss of life  Patient voiced understanding.)        Anesthesia Quick Evaluation

## 2019-11-15 LAB — CBC
HCT: 36.2 % — ABNORMAL LOW (ref 39.0–52.0)
Hemoglobin: 11.8 g/dL — ABNORMAL LOW (ref 13.0–17.0)
MCH: 31.4 pg (ref 26.0–34.0)
MCHC: 32.6 g/dL (ref 30.0–36.0)
MCV: 96.3 fL (ref 80.0–100.0)
Platelets: 207 10*3/uL (ref 150–400)
RBC: 3.76 MIL/uL — ABNORMAL LOW (ref 4.22–5.81)
RDW: 12.6 % (ref 11.5–15.5)
WBC: 7.7 10*3/uL (ref 4.0–10.5)
nRBC: 0 % (ref 0.0–0.2)

## 2019-11-15 LAB — BASIC METABOLIC PANEL
Anion gap: 7 (ref 5–15)
BUN: 16 mg/dL (ref 8–23)
CO2: 29 mmol/L (ref 22–32)
Calcium: 8.2 mg/dL — ABNORMAL LOW (ref 8.9–10.3)
Chloride: 101 mmol/L (ref 98–111)
Creatinine, Ser: 0.82 mg/dL (ref 0.61–1.24)
GFR calc Af Amer: >60 mL/min (ref >60)
GFR calc non Af Amer: >60 mL/min (ref >60)
Glucose, Bld: 140 mg/dL — ABNORMAL HIGH (ref 70–99)
Potassium: 4.1 mmol/L (ref 3.5–5.1)
Sodium: 137 mmol/L (ref 135–145)

## 2019-11-15 MED ORDER — OXYCODONE HCL 5 MG PO TABS: 5.0000 mg | ORAL_TABLET | ORAL | 0 refills | Status: DC | PRN

## 2019-11-15 MED ORDER — HYDROCODONE-ACETAMINOPHEN 5-325 MG PO TABS
1.0000 | ORAL_TABLET | ORAL | Status: DC | PRN
  Administered 2019-11-15 – 2019-11-16 (×3): 2 via ORAL
  Administered 2019-11-16: 1 via ORAL
  Filled 2019-11-15 (×4): qty 2
  Filled 2019-11-15: qty 1

## 2019-11-15 MED ORDER — TRAMADOL HCL 50 MG PO TABS: 50.0000 mg | ORAL_TABLET | Freq: Four times a day (QID) | ORAL | 0 refills | Status: DC | PRN

## 2019-11-15 MED ORDER — ENOXAPARIN SODIUM 40 MG/0.4ML ~~LOC~~ SOLN: 40.0000 mg | SUBCUTANEOUS | 0 refills | Status: DC

## 2019-11-15 MED ORDER — CYCLOBENZAPRINE HCL 10 MG PO TABS
10.0000 mg | ORAL_TABLET | Freq: Three times a day (TID) | ORAL | Status: DC | PRN
  Administered 2019-11-15 (×2): 10 mg via ORAL
  Filled 2019-11-15 (×2): qty 1

## 2019-11-15 NOTE — Discharge Instructions (Signed)
Instructions after Total Hip Replacement     J. Jeffrey Poggi, M.D.  J. Lance Hedda Crumbley, PA-C     Dept. of Orthopaedics & Sports Medicine  [facility]  [address]  [place], [place]  [address]  Phone: [phone]   Fax: [phone]    DIET: . Drink plenty of non-alcoholic fluids. . Resume your normal diet. Include foods high in fiber.  ACTIVITY:  . You may use crutches or a walker with weight-bearing as tolerated, unless instructed otherwise. . You may be weaned off of the walker or crutches by your Physical Therapist.  . Do NOT reach below the level of your knees or cross your legs until allowed.    . Continue doing gentle exercises. Exercising will reduce the pain and swelling, increase motion, and prevent muscle weakness.   . Please continue to use the TED compression stockings for 6 weeks. You may remove the stockings at night, but should reapply them in the morning. . Do not drive or operate any equipment until instructed.  WOUND CARE:  . Continue to use ice packs periodically to reduce pain and swelling. . Keep the incision clean and dry. . You may bathe or shower after the staples are removed at the first office visit following surgery.  MEDICATIONS: . You may resume your regular medications. . Please take the pain medication as prescribed on the medication. . Do not take pain medication on an empty stomach. . You have been given a prescription for a blood thinner to prevent blood clots. Please take the medication as instructed. (NOTE: After completing a 2 week course of Lovenox, take one Enteric-coated aspirin once a day.) . Pain medications and iron supplements can cause constipation. Use a stool softener (Senokot or Colace) on a daily basis and a laxative (dulcolax or miralax) as needed. . Do not drive or drink alcoholic beverages when taking pain medications.  CALL THE OFFICE FOR: . Temperature above 101 degrees . Excessive bleeding or drainage on the  dressing. . Excessive swelling, coldness, or paleness of the toes. . Persistent nausea and vomiting.  FOLLOW-UP:  . You should have an appointment to return to the office in 2 weeks after surgery. . Arrangements have been made for continuation of Physical Therapy (either home therapy or outpatient therapy).  

## 2019-11-15 NOTE — Discharge Summary (Addendum)
Physician Discharge Summary  Patient ID: Patrick Mason MRN: [id number] DOB/AGE: 03-20-1956 65 y.o.  Admit date: 11/14/2019 Discharge date: 11/16/2019  Admission Diagnoses:  Status post total hip replacement, left [Z96.642]  Discharge Diagnoses: Patient Active Problem List   Diagnosis Date Noted  . Status post total hip replacement, left 11/14/2019    Past Medical History:  Diagnosis Date  . Arthritis   . Cancer ([place]) 2017   posterior wall of bladder  . Carpal tunnel syndrome   . Diabetes mellitus without complication ([place])    pt denies 05/10/17  . GERD (gastroesophageal reflux disease)    H/O  . Hyperlipidemia   . Hypertension   . Psoriasis   . Right rotator cuff tear      Transfusion: None.   Consultants (if any):   Discharged Condition: Improved  Hospital Course: Patrick Mason is an 64 y.o. male who was admitted 11/14/2019 with a diagnosis of primary osteoarthritis of the left hip and went to the operating room on 11/14/2019 and underwent the above named procedures.    Surgeries: Procedure(s): TOTAL HIP ARTHROPLASTY on 11/14/2019 Patient tolerated the surgery well. Taken to PACU where she was stabilized and then transferred to the orthopedic floor.  Started on Lovenox 40mg  q 24 hrs. Foot pumps applied bilaterally at 80 mm. Heels elevated on bed with rolled towels. No evidence of DVT. Negative Homan. Physical therapy started on day #1 for gait training and transfer. OT started day #1 for ADL and assisted devices.  Patient's IV was removed on POD1.  Switched patient from oxycodone to hydrocodone on POD1 which helped with his overall pain.  Implants: Biomet press-fit system with a #13 laterally offset Echo femoral stem, a 52 mm acetabular shell with an E-poly hi-wall liner, and a 36 mm ceramic head with a +3 mm neck.  He was given perioperative antibiotics:  Anti-infectives (From admission, onward)   Start     Dose/Rate Route Frequency Ordered Stop   11/14/19  1600  ceFAZolin (ANCEF) 3 g in dextrose 5 % 50 mL IVPB     3 g 100 mL/hr over 30 Minutes Intravenous Every 6 hours 11/14/19 1126 11/15/19 0532   11/14/19 0600  ceFAZolin (ANCEF) 3 g in dextrose 5 % 50 mL IVPB     3 g 100 mL/hr over 30 Minutes Intravenous On call to O.R. 11/13/19 2152 11/14/19 0740    .  He was given sequential compression devices, early ambulation, and Lovenox for DVT prophylaxis.  He benefited maximally from the hospital stay and there were no complications.    Recent vital signs:  Vitals:   11/16/19 0607 11/16/19 0754  BP: (!) 147/76 (!) 146/84  Pulse: (!) 55 83  Resp: 16 18  Temp: 98.3 F (36.8 C) 99.1 F (37.3 C)  SpO2: 98% 94%    Recent laboratory studies:  Lab Results  Component Value Date   HGB 11.8 (L) 11/16/2019   HGB 11.8 (L) 11/15/2019   HGB 14.8 11/10/2019   Lab Results  Component Value Date   WBC 8.1 11/16/2019   PLT 204 11/16/2019   No results found for: INR Lab Results  Component Value Date   NA 136 11/16/2019   K 3.7 11/16/2019   CL 98 11/16/2019   CO2 30 11/16/2019   BUN 14 11/16/2019   CREATININE 0.74 11/16/2019   GLUCOSE 135 (H) 11/16/2019    Discharge Medications:   Allergies as of 11/16/2019   No Known Allergies  Medication List    TAKE these medications   acetaminophen 500 MG tablet Commonly known as: TYLENOL Take 500 mg by mouth daily. Additional 500 mg as needed for pain   atorvastatin 20 MG tablet Commonly known as: LIPITOR Take 20 mg by mouth daily at 6 PM.   clobetasol ointment 0.05 % Commonly known as: TEMOVATE Apply 1 application topically daily as needed (psoriasis).   cyclobenzaprine 10 MG tablet Commonly known as: FLEXERIL Take 1 tablet (10 mg total) by mouth 3 (three) times daily as needed for muscle spasms.   enoxaparin 40 MG/0.4ML injection Commonly known as: LOVENOX Inject 0.4 mLs (40 mg total) into the skin daily.   fluticasone 50 MCG/ACT nasal spray Commonly known as: FLONASE Place  2 sprays into the nose daily as needed for allergies.   HYDROcodone-acetaminophen 5-325 MG tablet Commonly known as: NORCO/VICODIN Take 1-2 tablets by mouth every 4 (four) hours as needed for moderate pain.   ibuprofen 200 MG tablet Commonly known as: ADVIL Take 200 mg by mouth daily. Additional as needed for pain   olmesartan-hydrochlorothiazide 20-12.5 MG tablet Commonly known as: BENICAR HCT Take 1 tablet by mouth every morning.   Salonpas 3.07-25-08 % Ptch Generic drug: Camphor-Menthol-Methyl Sal Apply 1 application topically daily as needed (Pain).   traMADol 50 MG tablet Commonly known as: ULTRAM Take 1 tablet (50 mg total) by mouth every 6 (six) hours as needed for moderate pain.            Durable Medical Equipment  (From admission, onward)         Start     Ordered   11/14/19 1127  DME Bedside commode  Once    Question:  Patient needs a bedside commode to treat with the following condition  Answer:  Status post total hip replacement, left   11/14/19 1126   11/14/19 1127  DME 3 n 1  Once     11/14/19 1126   11/14/19 1127  DME Walker rolling  Once    Question Answer Comment  Walker: With 5 Inch Wheels   Patient needs a walker to treat with the following condition Status post total hip replacement, left      11/14/19 1126          Diagnostic Studies: DG HIP UNILAT W OR W/O PELVIS 2-3 VIEWS LEFT  Result Date: 11/14/2019 CLINICAL DATA:  Status post left total hip replacement. EXAM: DG HIP (WITH OR WITHOUT PELVIS) 2-3V LEFT COMPARISON:  None. FINDINGS: The left femoral and acetabular components appear to be well situated. Expected postoperative changes are noted in the surrounding soft tissues. IMPRESSION: Status post left total hip arthroplasty. Electronically Signed   By: Marijo Conception M.D.   On: 11/14/2019 10:49   Disposition: Discharge home this afternoon with HHPT.  Follow-up Information    Lattie Corns, PA-C Follow up in 14 day(s).   Specialty:  Physician Assistant Why: Electa Sniff information: [place] [address] [phone]          Signed: Judson Roch PA-C 11/16/2019, 10:07 AM

## 2019-11-15 NOTE — Progress Notes (Signed)
Physical Therapy Treatment Patient Details Name: Patrick Mason MRN: [id number] DOB: 03-10-1956 Today's Date: 11/15/2019    History of Present Illness Pt admitted for L THR. HIstory includes HTN, GERD, DM, and obesity    PT Comments    Pt is making good progress towards goals with anticipated discharge this date per patient. Stair training performed safely in addition to ambulation/ther-ex. Reviewed hip precautions with pt. Pain adequately controlled this afternoon. Written HEP reviewed. Will continue to progress as able. Has met PT goals this date.   Follow Up Recommendations  Home health PT     Equipment Recommendations  Rolling walker with 5" wheels    Recommendations for Other Services       Precautions / Restrictions Precautions Precautions: Fall;Posterior Hip Precaution Booklet Issued: Yes (comment) Restrictions Weight Bearing Restrictions: Yes LLE Weight Bearing: Weight bearing as tolerated    Mobility  Bed Mobility Overal bed mobility: Needs Assistance Bed Mobility: Sit to Supine     Supine to sit: Min guard Sit to supine: Min guard   General bed mobility comments: safe technique with ability to sequencing task in order to maintain hip precautions. Once supine in bed, able to reposition safely  Transfers Overall transfer level: Needs assistance Equipment used: Rolling walker (2 wheeled) Transfers: Sit to/from Stand Sit to Stand: Min assist         General transfer comment: safe technique with upright posture.   Ambulation/Gait Ambulation/Gait assistance: Min guard Gait Distance (Feet): 200 Feet Assistive device: Rolling walker (2 wheeled) Gait Pattern/deviations: Step-through pattern     General Gait Details: pain limited, however improved reciprocal gait pattern noted with symmetrical step length. Guarding noted with heavy B UE assist on RW.   Stairs Stairs: Yes Stairs assistance: Min guard Stair Management: No rails;Backwards;Step to  pattern Number of Stairs: 3 General stair comments: therapist demonstrated prior to performance. Able to perform stair training with cga safely. Wife present during session and educated on proper technique   Wheelchair Mobility    Modified Rankin (Stroke Patients Only)       Balance Overall balance assessment: Needs assistance Sitting-balance support: Feet supported Sitting balance-Leahy Scale: Good Sitting balance - Comments: Steady static sitting, reaching within BOS   Standing balance support: Bilateral upper extremity supported Standing balance-Leahy Scale: Good                              Cognition Arousal/Alertness: Awake/alert Behavior During Therapy: WFL for tasks assessed/performed Overall Cognitive Status: Within Functional Limits for tasks assessed                                        Exercises Other Exercises Other Exercises: educated on hip precautions. Supine ther-ex performed x 12 reps with AP, quad sets, glut sets, SAQ, and hip abd/add. min assist needed. Tends to hold breath during ther-ex. Reviewed written HEP    General Comments        Pertinent Vitals/Pain Pain Assessment: 0-10 Pain Score: 7  Pain Location: L hip Pain Descriptors / Indicators: Sharp;Guarding;Grimacing Pain Intervention(s): Limited activity within patient's tolerance;Premedicated before session;Repositioned;Ice applied    Home Living                      Prior Function            PT Goals (current  goals can now be found in the care plan section) Acute Rehab PT Goals Patient Stated Goal: to go home PT Goal Formulation: With patient Time For Goal Achievement: 11/28/19 Potential to Achieve Goals: Good Progress towards PT goals: Progressing toward goals    Frequency    BID      PT Plan Current plan remains appropriate    Co-evaluation              AM-PAC PT "6 Clicks" Mobility   Outcome Measure  Help needed turning  from your back to your side while in a flat bed without using bedrails?: A Little Help needed moving from lying on your back to sitting on the side of a flat bed without using bedrails?: A Little Help needed moving to and from a bed to a chair (including a wheelchair)?: A Little Help needed standing up from a chair using your arms (e.g., wheelchair or bedside chair)?: A Little Help needed to walk in hospital room?: A Little Help needed climbing 3-5 steps with a railing? : A Little 6 Click Score: 18    End of Session Equipment Utilized During Treatment: Gait belt Activity Tolerance: Patient tolerated treatment well Patient left: in bed;with bed alarm set;with SCD's reapplied Nurse Communication: Mobility status PT Visit Diagnosis: Muscle weakness (generalized) (M62.81);Difficulty in walking, not elsewhere classified (R26.2);Pain Pain - Right/Left: Left Pain - part of body: Hip     Time: 7124-5809 PT Time Calculation (min) (ACUTE ONLY): 30 min  Charges:  $Gait Training: 8-22 mins $Therapeutic Exercise: 8-22 mins                     Greggory Stallion, PT, DPT [phone]    Davinder Haff 11/15/2019, 3:24 PM

## 2019-11-15 NOTE — TOC Initial Note (Signed)
Transition of Care [facility]) - Initial/Assessment Note    Patient Details  Name: Patrick Mason MRN: [id number] Date of Birth: [birth year]  [facility] CM/SW Contact:    Su Hilt, RN Phone Number: 11/15/2019, 2:18 PM  Clinical Narrative:                 Met with the patient to discuss DC plan  And  Needs He lives at home with his spouse and she provides transportation  I took a RW and a 3 in 1 to the room provided by [facility] to take home with him He is set [facility] with [facility] I provided him with the price of  The Lovenox as $10 per his insurance company He is up to date with his PCP, no additional needs  Expected Discharge Plan: [place] Barriers to Discharge: Barriers Resolved   Patient Goals and CMS Choice Patient states their goals for this hospitalization and ongoing recovery are:: go home      Expected Discharge Plan and Services Expected Discharge Plan: [place]   Discharge Planning Services: CM Consult   Living arrangements for the past 2 months: Single Family Home                 DME Arranged: 3-N-1, Walker rolling DME Agency: [employer] Date DME Agency Contacted: 11/15/19 Time DME Agency Contacted: [phone] Representative spoke with at DME Agency: [place]: PT [place]: [facility] (formerly [employer]) Date [place]: 11/15/19 Time [place]: 7 Representative spoke with at [place]: Helene Kelp  Prior Living Arrangements/Services Living arrangements for the past 2 months: [place] Lives with:: Spouse Patient language and need for interpreter reviewed:: Yes Do you feel safe going back to the place where you live?: Yes      Need for Family Participation in Patient Care: No (Comment) Care giver support system in place?: Yes (comment)   Criminal Activity/Legal Involvement Pertinent to Current Situation/Hospitalization: No - Comment as  needed  Activities of Daily Living Home Assistive Devices/Equipment: Blood pressure cuff ADL Screening (condition at time of admission) Patient's cognitive ability adequate to safely complete daily activities?: Yes Is the patient deaf or have difficulty hearing?: No Does the patient have difficulty seeing, even when wearing glasses/contacts?: No Does the patient have difficulty concentrating, remembering, or making decisions?: No Patient able to express need for assistance with ADLs?: Yes Does the patient have difficulty dressing or bathing?: No Independently performs ADLs?: Yes (appropriate for developmental age) Does the patient have difficulty walking or climbing stairs?: No Weakness of Legs: None Weakness of Arms/Hands: None  Permission Sought/Granted   Permission granted to share information with : Yes, Verbal Permission Granted              Emotional Assessment Appearance:: Appears stated age Attitude/Demeanor/Rapport: Engaged Affect (typically observed): Appropriate Orientation: : Oriented to Self, Oriented to Place, Oriented to  Time, Oriented to Situation Alcohol / Substance Use: Not Applicable Psych Involvement: No (comment)  Admission diagnosis:  Status post total hip replacement, left [H67.591] Patient Active Problem List   Diagnosis Date Noted  . Status post total hip replacement, left 11/14/2019   PCP:  Elaina Pattee, MD Pharmacy:   [id number] [place] [id number]: [phone]: [phone]    Social Determinants of Health (SDOH) Interventions  Readmission Risk Interventions No flowsheet data found.

## 2019-11-15 NOTE — Progress Notes (Signed)
  Subjective: 1 Day Post-Op Procedure(s) (LRB): TOTAL HIP ARTHROPLASTY (Left) Patient reports pain as mild.   Patient is well, and has had no acute complaints or problems Plan is to go Home after hospital stay. Negative for chest pain and shortness of breath Fever: no Gastrointestinal:Negative for nausea and vomiting  Objective: Vital signs in last 24 hours: Temp:  [97.3 F (36.3 C)-98.3 F (36.8 C)] 98.1 F (36.7 C) (04/28 1216) Pulse Rate:  [78-110] 108 (04/28 1216) Resp:  [16-20] 16 (04/28 1216) BP: (130-142)/(61-75) 135/61 (04/28 1216) SpO2:  [94 %-97 %] 95 % (04/28 1216)  Intake/Output from previous day:  Intake/Output Summary (Last 24 hours) at 11/15/2019 1628 Last data filed at 11/15/2019 1300 Gross per 24 hour  Intake 412.26 ml  Output 920 ml  Net -507.74 ml    Intake/Output this shift: Total I/O In: 120 [P.O.:120] Out: 300 [Urine:300]  Labs: Recent Labs    11/15/19 0416  HGB 11.8*   Recent Labs    11/15/19 0416  WBC 7.7  RBC 3.76*  HCT 36.2*  PLT 207   Recent Labs    11/15/19 0416  NA 137  K 4.1  CL 101  CO2 29  BUN 16  CREATININE 0.82  GLUCOSE 140*  CALCIUM 8.2*   No results for input(s): LABPT, INR in the last 72 hours.   EXAM General - Patient is Alert, Appropriate and Oriented Extremity - Neurovascular intact Sensation intact distally Intact pulses distally Dorsiflexion/Plantar flexion intact Incision: scant drainage No cellulitis present Dressing/Incision - Mild bloody drainage noted to the left hip. Motor Function - intact, moving foot and toes well on exam.  Abdomen with some distention but intact bowel sounds.  No pain with palpation.  Past Medical History:  Diagnosis Date  . Arthritis   . 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 Carpal tunnel syndrome   . Diabetes mellitus without complication ([place])    pt denies 05/10/17  . GERD (gastroesophageal reflux disease)    H/O  . Hyperlipidemia   . Hypertension    . Psoriasis   . Right rotator cuff tear     Assessment/Plan: 1 Day Post-Op Procedure(s) (LRB): TOTAL HIP ARTHROPLASTY (Left) Active Problems:   Status post total hip replacement, left  Estimated body mass index is 39.87 kg/m as calculated from the following:   Height as of this encounter: 5\' 9"  (1.753 m).   Weight as of this encounter: 122.5 kg. Advance diet Up with therapy D/C IV fluids when tolerating po intake.  Labs reviewed this AM. Patient has cleared all activities with PT. Patient is passing gas without pain. Plan for discharge home this afternoon with HHPT.  DVT Prophylaxis - Lovenox, Foot Pumps and TED hose Weight-Bearing as tolerated to left leg  J. Cameron Proud, PA-C [facility] Orthopa[facility] 11/15/2019, 4:28 PM

## 2019-11-15 NOTE — Evaluation (Signed)
Occupational Therapy Evaluation Patient Details Name: Patrick Mason MRN: [id number] DOB: March 20, 1956 Today's Date: 11/15/2019    History of Present Illness Pt admitted for L THR. HIstory includes HTN, GERD, DM, and obesity   Clinical Impression   Mr. Yap was seen for OT evaluation this date, POD#1 from above surgery. Pt was modified independent in all ADLs prior to surgery. He endorses using a sock aid and LH shoe horn at baseline to assist with LB dressing. He also endorses self-limiting his activity recently due to significant hip pain. 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 Pt is eager to return to PLOF with less pain and improved safety and independence. Pt currently requires moderate assist for LB dressing while in seated position due to pain and limited AROM of L hip. Pt able to recall 2/3 posterior total hip precautions at start of session and unable to verbalize how to implement during ADL and mobility. Pt instructed in posterior total hip precautions and how to implement, self care skills, falls prevention strategies, home/routines modifications, DME/AE for LB bathing and dressing tasks, compression stocking mgt strategies, and car transfer techniques. At end of session, pt able to recall 3/3 posterior total hip precautions. Pt would benefit from additional instruction in self care skills and techniques to help maintain precautions with or without assistive devices to support recall and carryover prior to discharge. Recommend HHOT upon discharge.       Follow Up Recommendations  Home health OT    Equipment Recommendations  3 in 1 bedside commode(Bari BSC)    Recommendations for Other Services       Precautions / Restrictions Precautions Precautions: Fall;Posterior Hip Precaution Booklet Issued: No Restrictions Weight Bearing Restrictions: Yes LLE Weight Bearing: Weight bearing as tolerated      Mobility Bed Mobility               General bed mobility  comments: Deferred. Pt in recliner at start/end of session.  Transfers Overall transfer level: Needs assistance Equipment used: Rolling walker (2 wheeled) Transfers: Sit to/from Stand Sit to Stand: Min assist         General transfer comment: Pt noted with poor eccentric control for decent into seated position from standing. Decreased safety awareness with stand>sit. Educated on safe use of BUE to lower self while adhering to posterior THPs.    Balance Overall balance assessment: Needs assistance Sitting-balance support: Feet supported Sitting balance-Leahy Scale: Good Sitting balance - Comments: Steady static sitting, reaching within BOS   Standing balance support: Bilateral upper extremity supported Standing balance-Leahy Scale: Fair                             ADL either performed or assessed with clinical judgement   ADL Overall ADL's : Needs assistance/impaired                                       General ADL Comments: Pt presents with increased pain, decreased AROM of his LLE, and decreased activity tolerance which functionally limit his ability to perform basic activities of daily living. Min A for functional transfer to [facility] with SBA for mgt of lines/leads. CGA for standing grooming/hygiene tasks. Set-up/supervision for UB grooming/dressing/bathing. MOD A for seated LB bathing/dressing.     Vision Patient Visual Report: No change from baseline       Perception  Praxis      Pertinent Vitals/Pain Pain Assessment: 0-10 Pain Score: 8  Pain Location: L hip Pain Descriptors / Indicators: Sharp;Guarding;Grimacing Pain Intervention(s): Limited activity within patient's tolerance;Monitored during session;Repositioned;RN gave pain meds during session     Hand Dominance     Extremity/Trunk Assessment Upper Extremity Assessment Upper Extremity Assessment: Overall WFL for tasks assessed   Lower Extremity Assessment Lower Extremity  Assessment: LLE deficits/detail;Generalized weakness LLE Deficits / Details: s/p L THA with posterior hip precautions.       Communication Communication Communication: No difficulties   Cognition Arousal/Alertness: Awake/alert Behavior During Therapy: WFL for tasks assessed/performed Overall Cognitive Status: Within Functional Limits for tasks assessed                                     General Comments       Exercises Other Exercises Other Exercises: Pt educated on falls prevention strategies, posterior hip precautions, compensatory bathing, dressing, and mobility strategies in consideration of posterior THPs, safe use of AE/DME for ADL management, and routines modifications to support safety and fxl independence upon hospital DC. Other Exercises: OT facilitates pt use of BSC this date. See ADL section for additional details on functional performance.   Shoulder Instructions      Home Living Family/patient expects to be discharged to:: Private residence Living Arrangements: Spouse/significant other Available Help at Discharge: Family Type of Home: House Home Access: Stairs to enter Technical brewer of Steps: 3 Entrance Stairs-Rails: Right Home Layout: One level     Bathroom Shower/Tub: Occupational psychologist: Handicapped height     Home Equipment: Radiation protection practitioner Equipment: Sock aid;Reacher;Long-handled shoe horn Additional Comments: Has Risk analyst.      Prior Functioning/Environment Level of Independence: Independent with assistive device(s)        Comments: Pt is independent with with AE for ADL/IADL management, works as a Air cabin crew, and has been working at home more often 2/2 pain.        OT Problem List: Decreased strength;Decreased coordination;Pain;Cardiopulmonary status limiting activity;Decreased activity tolerance;Decreased safety awareness;Impaired balance (sitting and/or standing);Decreased  knowledge of use of DME or AE;Decreased knowledge of precautions      OT Treatment/Interventions: Self-care/ADL training;Therapeutic exercise;Therapeutic activities;DME and/or AE instruction;Patient/family education;Balance training    OT Goals(Current goals can be found in the care plan section) Acute Rehab OT Goals Patient Stated Goal: to go home OT Goal Formulation: With patient Time For Goal Achievement: 11/29/19 Potential to Achieve Goals: Good ADL Goals Pt Will Perform Lower Body Dressing: sit to/from stand;with min assist;with caregiver independent in assisting(c LRAD PRN for improved functional independence, adherence to posterior THPs, and safety.) Pt Will Transfer to Toilet: ambulating;bedside commode;with supervision;with set-up(c LRAD PRN for improved functional independence, adherence to posterior THPs, and safety.) Pt Will Perform Toileting - Clothing Manipulation and hygiene: sit to/from stand;with supervision;with set-up;with adaptive equipment(c LRAD PRN for improved functional independence, adherence to posterior THPs, and safety.)  OT Frequency: Min 2X/week   Barriers to D/C:            Co-evaluation              AM-PAC OT "6 Clicks" Daily Activity     Outcome Measure Help from another person eating meals?: None Help from another person taking care of personal grooming?: A Little Help from another person toileting, which includes using toliet, bedpan, or urinal?: A Little Help from  another person bathing (including washing, rinsing, drying)?: A Lot Help from another person to put on and taking off regular upper body clothing?: A Little Help from another person to put on and taking off regular lower body clothing?: A Lot 6 Click Score: 17   End of Session Equipment Utilized During Treatment: Gait belt;Rolling walker;Other (comment)(BARI BSC)  Activity Tolerance: Patient tolerated treatment well Patient left: in chair;with call bell/phone within reach;with  chair alarm set;with family/visitor present;with SCD's reapplied  OT Visit Diagnosis: Other abnormalities of gait and mobility (R26.89);Pain Pain - Right/Left: Left Pain - part of body: Hip                Time: [id number] OT Time Calculation (min): 38 min Charges:  OT General Charges $OT Visit: 1 Visit OT Evaluation $OT Eval Moderate Complexity: 1 Mod OT Treatments $Self Care/Home Management : 23-37 mins  Shara Blazing, M.S., OTR/L Ascom: [phone] 11/15/19, 11:00 AM

## 2019-11-15 NOTE — Progress Notes (Signed)
Physical Therapy Treatment Patient Details Name: Patrick Mason MRN: [id number] DOB: 05/11/1956 Today's Date: 11/15/2019    History of Present Illness Pt admitted for L THR. HIstory includes HTN, GERD, DM, and obesity    PT Comments    Pt is making good progress towards goals with ability to ambulate in hallway, however does appear to be limited by pain. Does report decreased pain with OOB mobility. Continues to be motivated to perform therapy. Good endurance noted with HEP, reviewed hip precautions, able to recall 2/3. Will continue to progress as able.   Follow Up Recommendations  Home health PT     Equipment Recommendations  Rolling walker with 5" wheels    Recommendations for Other Services       Precautions / Restrictions Precautions Precautions: Fall;Posterior Hip Precaution Booklet Issued: Yes (comment) Restrictions Weight Bearing Restrictions: Yes LLE Weight Bearing: Weight bearing as tolerated    Mobility  Bed Mobility Overal bed mobility: Needs Assistance Bed Mobility: Supine to Sit     Supine to sit: Min guard     General bed mobility comments: improved technique while maintaining hip precautions  Transfers Overall transfer level: Needs assistance Equipment used: Rolling walker (2 wheeled) Transfers: Sit to/from Stand Sit to Stand: Min assist         General transfer comment: needs bed elevated to perform transfer. Cues for hip precautions. Decreased eccentric control during transfers  Ambulation/Gait Ambulation/Gait assistance: Min guard Gait Distance (Feet): 80 Feet Assistive device: Rolling walker (2 wheeled) Gait Pattern/deviations: Step-through pattern     General Gait Details: pain limited, however demonstrates reciprocal gait pattern and upright posture. Heavy B UE WBing noted on RW. Fatigues with increased distance.   Stairs             Wheelchair Mobility    Modified Rankin (Stroke Patients Only)       Balance Overall  balance assessment: Needs assistance Sitting-balance support: Feet supported Sitting balance-Leahy Scale: Good Sitting balance - Comments: Steady static sitting, reaching within BOS   Standing balance support: Bilateral upper extremity supported Standing balance-Leahy Scale: Fair                              Cognition Arousal/Alertness: Awake/alert Behavior During Therapy: WFL for tasks assessed/performed Overall Cognitive Status: Within Functional Limits for tasks assessed                                        Exercises Other Exercises Other Exercises: educated on hip precautions. Supine ther-ex performed x 12 reps with AP, quad sets, glut sets, hip add squeezes, and hip abd/add. min assist needed. Tends to hold breath during ther-ex. Reviewed written HEP Other Exercises: Pt educated on falls prevention strategies, posterior hip precautions, compensatory bathing, dressing, and mobility strategies in consideration of posterior THPs, safe use of AE/DME for ADL management, and routines modifications to support safety and fxl independence upon hospital DC. Other Exercises: OT facilitates pt use of BSC this date. See ADL section for additional details on functional performance.    General Comments        Pertinent Vitals/Pain Pain Assessment: 0-10 Pain Score: 9  Pain Location: L hip Pain Descriptors / Indicators: Sharp;Guarding;Grimacing Pain Intervention(s): Limited activity within patient's tolerance;Ice applied;Patient requesting pain meds-RN notified    Home Living Family/patient expects to be discharged to:: Private  residence Living Arrangements: Spouse/significant other Available Help at Discharge: Family Type of Home: House Home Access: Stairs to enter Entrance Stairs-Rails: Right Home Layout: One level Home Equipment: Adaptive equipment Additional Comments: Has lift recliner.    Prior Function Level of Independence: Independent with  assistive device(s)      Comments: Pt is independent with with AE for ADL/IADL management, works as a Air cabin crew, and has been working at home more often 2/2 pain.   PT Goals (current goals can now be found in the care plan section) Acute Rehab PT Goals Patient Stated Goal: to go home PT Goal Formulation: With patient Time For Goal Achievement: 11/28/19 Potential to Achieve Goals: Good Progress towards PT goals: Progressing toward goals    Frequency    BID      PT Plan Current plan remains appropriate    Co-evaluation              AM-PAC PT "6 Clicks" Mobility   Outcome Measure  Help needed turning from your back to your side while in a flat bed without using bedrails?: A Little Help needed moving from lying on your back to sitting on the side of a flat bed without using bedrails?: A Little Help needed moving to and from a bed to a chair (including a wheelchair)?: A Little Help needed standing up from a chair using your arms (e.g., wheelchair or bedside chair)?: A Little Help needed to walk in hospital room?: A Little Help needed climbing 3-5 steps with a railing? : A Little 6 Click Score: 18    End of Session Equipment Utilized During Treatment: Gait belt Activity Tolerance: Patient tolerated treatment well Patient left: in chair;with chair alarm set;with SCD's reapplied Nurse Communication: Mobility status PT Visit Diagnosis: Muscle weakness (generalized) (M62.81);Difficulty in walking, not elsewhere classified (R26.2);Pain Pain - Right/Left: Left Pain - part of body: Hip     Time: [id number] PT Time Calculation (min) (ACUTE ONLY): 23 min  Charges:  $Gait Training: 8-22 mins $Therapeutic Exercise: 8-22 mins                     Patrick Mason, PT, DPT [phone]    Patrick Mason 11/15/2019, 12:39 PM

## 2019-11-16 LAB — BASIC METABOLIC PANEL
Anion gap: 8 (ref 5–15)
BUN: 14 mg/dL (ref 8–23)
CO2: 30 mmol/L (ref 22–32)
Calcium: 8.1 mg/dL — ABNORMAL LOW (ref 8.9–10.3)
Chloride: 98 mmol/L (ref 98–111)
Creatinine, Ser: 0.74 mg/dL (ref 0.61–1.24)
GFR calc Af Amer: >60 mL/min (ref >60)
GFR calc non Af Amer: >60 mL/min (ref >60)
Glucose, Bld: 135 mg/dL — ABNORMAL HIGH (ref 70–99)
Potassium: 3.7 mmol/L (ref 3.5–5.1)
Sodium: 136 mmol/L (ref 135–145)

## 2019-11-16 LAB — CBC
HCT: 35.6 % — ABNORMAL LOW (ref 39.0–52.0)
Hemoglobin: 11.8 g/dL — ABNORMAL LOW (ref 13.0–17.0)
MCH: 30.9 pg (ref 26.0–34.0)
MCHC: 33.1 g/dL (ref 30.0–36.0)
MCV: 93.2 fL (ref 80.0–100.0)
Platelets: 204 10*3/uL (ref 150–400)
RBC: 3.82 MIL/uL — ABNORMAL LOW (ref 4.22–5.81)
RDW: 12.6 % (ref 11.5–15.5)
WBC: 8.1 10*3/uL (ref 4.0–10.5)
nRBC: 0 % (ref 0.0–0.2)

## 2019-11-16 LAB — SURGICAL PATHOLOGY

## 2019-11-16 MED ORDER — HYDROCODONE-ACETAMINOPHEN 5-325 MG PO TABS: 1.0000 | ORAL_TABLET | ORAL | 0 refills | Status: DC | PRN

## 2019-11-16 MED ORDER — CYCLOBENZAPRINE HCL 10 MG PO TABS: 10.0000 mg | ORAL_TABLET | Freq: Three times a day (TID) | ORAL | 0 refills | Status: DC | PRN

## 2019-11-16 NOTE — Progress Notes (Signed)
  Subjective: 2 Days Post-Op Procedure(s) (LRB): TOTAL HIP ARTHROPLASTY (Left) Patient reports that his pain is much improved this AM. Tolerated Norco better than oxycodone. Patient is well, and has had no acute complaints or problems Plan is to go Home after hospital stay. Negative for chest pain and shortness of breath Fever: no Gastrointestinal:Negative for nausea and vomiting  Objective: Vital signs in last 24 hours: Temp:  [98.1 F (36.7 C)-99.1 F (37.3 C)] 98.3 F (36.8 C) (04/29 0607) Pulse Rate:  [55-123] 55 (04/29 0607) Resp:  [16-20] 16 (04/29 0607) BP: (120-147)/(61-76) 147/76 (04/29 0607) SpO2:  [78 %-98 %] 98 % (04/29 0607)  Intake/Output from previous day:  Intake/Output Summary (Last 24 hours) at 11/16/2019 0727 Last data filed at 11/16/2019 0416 Gross per 24 hour  Intake 240 ml  Output 650 ml  Net -410 ml    Intake/Output this shift: No intake/output data recorded.  Labs: Recent Labs    11/15/19 0416 11/16/19 0414  HGB 11.8* 11.8*   Recent Labs    11/15/19 0416 11/16/19 0414  WBC 7.7 8.1  RBC 3.76* 3.82*  HCT 36.2* 35.6*  PLT 207 204   Recent Labs    11/15/19 0416 11/16/19 0414  NA 137 136  K 4.1 3.7  CL 101 98  CO2 29 30  BUN 16 14  CREATININE 0.82 0.74  GLUCOSE 140* 135*  CALCIUM 8.2* 8.1*   No results for input(s): LABPT, INR in the last 72 hours.   EXAM General - Patient is Alert, Appropriate and Oriented Extremity - Neurovascular intact Sensation intact distally Intact pulses distally Dorsiflexion/Plantar flexion intact Incision: scant drainage No cellulitis present Dressing/Incision - Mild bloody drainage noted to the left hip. Motor Function - intact, moving foot and toes well on exam.  Abdomen with some distention but intact bowel sounds.  No pain with palpation.  Past Medical History:  Diagnosis Date  . Arthritis   . 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 Carpal tunnel syndrome   . Diabetes mellitus  without complication ([place])    pt denies 05/10/17  . GERD (gastroesophageal reflux disease)    H/O  . Hyperlipidemia   . Hypertension   . Psoriasis   . Right rotator cuff tear     Assessment/Plan: 2 Days Post-Op Procedure(s) (LRB): TOTAL HIP ARTHROPLASTY (Left) Active Problems:   Status post total hip replacement, left  Estimated body mass index is 39.87 kg/m as calculated from the following:   Height as of this encounter: 5\' 9"  (1.753 m).   Weight as of this encounter: 122.5 kg. Advance diet Up with therapy D/C IV fluids when tolerating po intake.  Labs reviewed this AM. Patient has cleared all activities with PT. Patient is passing gas without pain. Plan for discharge home today after morning session of PT. Will discharge home on Norco.  DVT Prophylaxis - Lovenox, Foot Pumps and TED hose Weight-Bearing as tolerated to left leg  J. Cameron Proud, PA-C [facility] Orthopaedic Surgery 11/16/2019, 7:27 AM

## 2019-11-16 NOTE — Progress Notes (Signed)
Physical Therapy Treatment Patient Details Name: Patrick Mason MRN: [id number] DOB: 09/30/1955 Today's Date: 11/16/2019    History of Present Illness Pt admitted for L THR. HIstory includes HTN, GERD, DM, and obesity    PT Comments    Pt is making good progress towards goals with ability to ambulate in hallway, does appear to be pain limited this date. Received supine in bed with 3L of O2. Sats WNL, removed O2 and monitored vitals throughout there-ex/mobility. O2 sats remained at 95% post exertion with HR increasing to 124bpm. Pt is safe to dc home this date. Did need reminders for hip precautions. RW delivered to room.   Follow Up Recommendations  Home health PT     Equipment Recommendations  Rolling walker with 5" wheels    Recommendations for Other Services       Precautions / Restrictions Precautions Precautions: Fall;Posterior Hip Precaution Booklet Issued: Yes (comment) Restrictions Weight Bearing Restrictions: Yes LLE Weight Bearing: Weight bearing as tolerated    Mobility  Bed Mobility Overal bed mobility: Needs Assistance Bed Mobility: Supine to Sit     Supine to sit: Min assist     General bed mobility comments: tends to hold breath during transition to EOB. Needs assist for trunkal posture and guidance of L LE. All mobility performed on RA, with sats WNL.  Transfers Overall transfer level: Needs assistance Equipment used: Rolling walker (2 wheeled) Transfers: Sit to/from Stand Sit to Stand: Min assist         General transfer comment: safe technique with upright posture.   Ambulation/Gait Ambulation/Gait assistance: Min guard Gait Distance (Feet): 80 Feet Assistive device: Rolling walker (2 wheeled) Gait Pattern/deviations: Step-through pattern     General Gait Details: pain limited, however still able to perform reciprocal gait pattern. Cues for rolling RW instead of picking up RW   Stairs             Wheelchair Mobility     Modified Rankin (Stroke Patients Only)       Balance Overall balance assessment: Needs assistance Sitting-balance support: Feet supported Sitting balance-Leahy Scale: Good Sitting balance - Comments: Steady static sitting, reaching within BOS   Standing balance support: Bilateral upper extremity supported Standing balance-Leahy Scale: Good                              Cognition Arousal/Alertness: Awake/alert Behavior During Therapy: WFL for tasks assessed/performed Overall Cognitive Status: Within Functional Limits for tasks assessed                                        Exercises Other Exercises Other Exercises: educated on hip precautions. Supine ther-ex performed x 15 reps with AP, quad sets, glut sets, SAQ, and hip abd/add. min assist needed. Tends to hold breath during ther-ex. Reviewed written HEP    General Comments        Pertinent Vitals/Pain Pain Assessment: 0-10 Pain Score: 5  Pain Location: L hip Pain Descriptors / Indicators: Sharp;Guarding;Grimacing Pain Intervention(s): Limited activity within patient's tolerance(needs ice pack, CNA notified)    Home Living                      Prior Function            PT Goals (current goals can now be found in the care plan  section) Acute Rehab PT Goals Patient Stated Goal: to go home PT Goal Formulation: With patient Time For Goal Achievement: 11/28/19 Potential to Achieve Goals: Good Progress towards PT goals: Progressing toward goals    Frequency    BID      PT Plan Current plan remains appropriate    Co-evaluation              AM-PAC PT "6 Clicks" Mobility   Outcome Measure  Help needed turning from your back to your side while in a flat bed without using bedrails?: A Little Help needed moving from lying on your back to sitting on the side of a flat bed without using bedrails?: A Little Help needed moving to and from a bed to a chair (including a  wheelchair)?: A Little Help needed standing up from a chair using your arms (e.g., wheelchair or bedside chair)?: A Little Help needed to walk in hospital room?: A Little Help needed climbing 3-5 steps with a railing? : A Little 6 Click Score: 18    End of Session Equipment Utilized During Treatment: Gait belt Activity Tolerance: Patient tolerated treatment well Patient left: in chair;with chair alarm set Nurse Communication: Mobility status PT Visit Diagnosis: Muscle weakness (generalized) (M62.81);Difficulty in walking, not elsewhere classified (R26.2);Pain Pain - Right/Left: Left Pain - part of body: Hip     Time: [id number] PT Time Calculation (min) (ACUTE ONLY): 27 min  Charges:  $Gait Training: 8-22 mins $Therapeutic Exercise: 8-22 mins                     Greggory Stallion, PT, DPT [phone]    [name] 11/16/2019, 11:20 AM

## 2020-03-15 ENCOUNTER — Other Ambulatory Visit: Payer: Self-pay | Admitting: Surgery

## 2020-03-15 DIAGNOSIS — M7581 Other shoulder lesions, right shoulder: Secondary | ICD-10-CM

## 2020-03-15 DIAGNOSIS — M12811 Other specific arthropathies, not elsewhere classified, right shoulder: Secondary | ICD-10-CM

## 2020-03-18 ENCOUNTER — Ambulatory Visit
Admission: RE | Admit: 2020-03-18 | Discharge: 2020-03-18 | Disposition: A | Discharge status: Home / Self Care | Payer: 59 | Source: Ambulatory Visit | Attending: Surgery | Admitting: Surgery

## 2020-03-18 ENCOUNTER — Other Ambulatory Visit: Payer: Self-pay

## 2020-03-18 DIAGNOSIS — M7581 Other shoulder lesions, right shoulder: Secondary | ICD-10-CM | POA: Insufficient documentation

## 2020-03-18 DIAGNOSIS — M12811 Other specific arthropathies, not elsewhere classified, right shoulder: Secondary | ICD-10-CM | POA: Diagnosis present

## 2020-03-29 ENCOUNTER — Other Ambulatory Visit: Payer: Self-pay | Admitting: Surgery

## 2020-04-03 ENCOUNTER — Other Ambulatory Visit: Payer: Self-pay

## 2020-04-03 ENCOUNTER — Encounter
Admission: RE | Admit: 2020-04-03 | Discharge: 2020-04-03 | Disposition: A | Discharge status: Home / Self Care | Payer: 59 | Source: Ambulatory Visit | Attending: Surgery | Admitting: Surgery

## 2020-04-03 ENCOUNTER — Other Ambulatory Visit: Payer: 59

## 2020-04-03 DIAGNOSIS — Z01818 Encounter for other preprocedural examination: Secondary | ICD-10-CM | POA: Insufficient documentation

## 2020-04-03 HISTORY — DX: Prediabetes: R73.03

## 2020-04-03 LAB — CBC WITH DIFFERENTIAL/PLATELET
Abs Immature Granulocytes: 0.02 10*3/uL (ref 0.00–0.07)
Basophils Absolute: 0.1 10*3/uL (ref 0.0–0.1)
Basophils Relative: 1 %
Eosinophils Absolute: 0.3 10*3/uL (ref 0.0–0.5)
Eosinophils Relative: 4 %
HCT: 43.8 % (ref 39.0–52.0)
Hemoglobin: 15 g/dL (ref 13.0–17.0)
Immature Granulocytes: 0 %
Lymphocytes Relative: 21 %
Lymphs Abs: 1.4 10*3/uL (ref 0.7–4.0)
MCH: 31 pg (ref 26.0–34.0)
MCHC: 34.2 g/dL (ref 30.0–36.0)
MCV: 90.5 fL (ref 80.0–100.0)
Monocytes Absolute: 0.5 10*3/uL (ref 0.1–1.0)
Monocytes Relative: 8 %
Neutro Abs: 4.3 10*3/uL (ref 1.7–7.7)
Neutrophils Relative %: 66 %
Platelets: 263 10*3/uL (ref 150–400)
RBC: 4.84 MIL/uL (ref 4.22–5.81)
RDW: 13.4 % (ref 11.5–15.5)
WBC: 6.5 10*3/uL (ref 4.0–10.5)
nRBC: 0 % (ref 0.0–0.2)

## 2020-04-03 LAB — TYPE AND SCREEN
ABO/RH(D): AB POS
Antibody Screen: NEGATIVE

## 2020-04-03 LAB — COMPREHENSIVE METABOLIC PANEL
ALT: 23 U/L (ref 0–44)
AST: 21 U/L (ref 15–41)
Albumin: 4 g/dL (ref 3.5–5.0)
Alkaline Phosphatase: 51 U/L (ref 38–126)
Anion gap: 10 (ref 5–15)
BUN: 10 mg/dL (ref 8–23)
CO2: 30 mmol/L (ref 22–32)
Calcium: 9.3 mg/dL (ref 8.9–10.3)
Chloride: 97 mmol/L — ABNORMAL LOW (ref 98–111)
Creatinine, Ser: 0.77 mg/dL (ref 0.61–1.24)
GFR calc Af Amer: >60 mL/min (ref >60)
GFR calc non Af Amer: >60 mL/min (ref >60)
Glucose, Bld: 105 mg/dL — ABNORMAL HIGH (ref 70–99)
Potassium: 4 mmol/L (ref 3.5–5.1)
Sodium: 137 mmol/L (ref 135–145)
Total Bilirubin: 0.8 mg/dL (ref 0.3–1.2)
Total Protein: 7.7 g/dL (ref 6.5–8.1)

## 2020-04-03 LAB — SURGICAL PCR SCREEN
MRSA, PCR: NEGATIVE
Staphylococcus aureus: POSITIVE — AB

## 2020-04-03 LAB — URINALYSIS, ROUTINE W REFLEX MICROSCOPIC
Bilirubin Urine: NEGATIVE
Glucose, UA: NEGATIVE mg/dL
Hgb urine dipstick: NEGATIVE
Ketones, ur: NEGATIVE mg/dL
Leukocytes,Ua: NEGATIVE
Nitrite: NEGATIVE
Protein, ur: NEGATIVE mg/dL
Specific Gravity, Urine: 1.011 (ref 1.005–1.030)
pH: 7 (ref 5.0–8.0)

## 2020-04-03 NOTE — Patient Instructions (Addendum)
Your procedure is scheduled on: April 09, 2020 Tuesday  Report to Day Surgery on the 2nd floor of the [facility]. To find out your arrival time, please call [phone] between 1PM - 3PM on: Monday April 08, 2020  REMEMBER: Instructions that are not followed completely may result in serious medical risk, up to and including death; or upon the discretion of your surgeon and anesthesiologist your surgery may need to be rescheduled.  Do not eat food after midnight the night before surgery.  No gum chewing, lozengers or hard candies.  You may however, drink CLEAR liquids up to 2 hours before you are scheduled to arrive for your surgery. Do not drink anything within 2 hours of your scheduled arrival time.  Clear liquids include: - water  BLACK COFFEE -NO CREAMER OR MILK  Do NOT drink anything that is not on this list.  Type 1 and Type 2 diabetics should only drink water.  In addition, your doctor has ordered for you to drink the provided  Ensure Pre-Surgery Clear Carbohydrate Drink  Drinking this carbohydrate drink up to two hours before surgery helps to reduce insulin resistance and improve patient outcomes. Please complete drinking 2 hours prior to scheduled arrival time.  TAKE THESE MEDICATIONS THE MORNING OF SURGERY WITH A SIP OF WATER: NONE  USE FLONASE IF NEEDED  One week prior to surgery: Stop Anti-inflammatories (NSAIDS) such as Advil, Aleve, Ibuprofen, Motrin, Naproxen, Naprosyn and Aspirin based products such as Excedrin, Goodys Powder, BC Powder. Stop ANY OVER THE COUNTER supplements until after surgery. (You may continue taking Tylenol, Vitamin D, Vitamin B, and multivitamin.)  No Alcohol for 24 hours before or after surgery.  No Smoking including e-cigarettes for 24 hours prior to surgery.  No chewable tobacco products for at least 6 hours prior to surgery.  No nicotine patches on the day of surgery.  Do not use any "recreational" drugs for at least a week  prior to your surgery.  Please be advised that the combination of cocaine and anesthesia may have negative outcomes, up to and including death. If you test positive for cocaine, your surgery will be cancelled.  On the morning of surgery brush your teeth with toothpaste and water, you may rinse your mouth with mouthwash if you wish. Do not swallow any toothpaste or mouthwash.  Do not wear jewelry, make-up, hairpins, clips or nail polish.  Do not wear lotions, powders, or perfumes.   Do not shave 48 hours prior to surgery.   Contact lenses, hearing aids and dentures may not be worn into surgery.  Do not bring valuables to the hospital. [facility] is not responsible for any missing/lost belongings or valuables.   Use CHG Soap as directed on instruction sheet.  Total Shoulder Arthroplasty:  use Benzolyl Peroxide 5% Gel as directed on instruction sheet.  Notify your doctor if there is any change in your medical condition (cold, fever, infection).  Wear comfortable clothing (specific to your surgery type) to the hospital.  Plan for stool softeners for home use; pain medications have a tendency to cause constipation. You can also help prevent constipation by eating foods high in fiber such as fruits and vegetables and drinking plenty of fluids as your diet allows.  After surgery, you can help prevent lung complications by doing breathing exercises.  Take deep breaths and cough every 1-2 hours. Your doctor may order a device called an Incentive Spirometer to help you take deep breaths. When coughing or sneezing, hold a  pillow firmly against your incision with both hands. This is called "splinting." Doing this helps protect your incision. It also decreases belly discomfort.  If you are being discharged the day of surgery, you will not be allowed to drive home. You will need a responsible adult (18 years or older) to drive you home and stay with you that night.   Please call the  [facility]. at [phone] if you have any questions about these instructions.  Visitation Policy:  Patients undergoing a surgery or procedure may have one family member or support person with them as long as that person is not COVID-19 positive or experiencing its symptoms.  That person may remain in the waiting area during the procedure.  Inpatient Visitation Update:   In an effort to ensure the safety of our team members and our patients, we are implementing a change to our visitation policy:  Effective Monday, Aug. 9, at 7 a.m., inpatients will be allowed one support person.  o The support person may change daily.  o The support person must pass our screening, gel in and out, and wear a mask at all times, including in the patient's room.  o Patients must also wear a mask when staff or their support person are in the room.  o Masking is required regardless of vaccination status.  Systemwide, no visitors 17 or younger.

## 2020-04-03 NOTE — Progress Notes (Signed)
  [facility] Perioperative Services: Pre-Admission/Anesthesia Testing  Abnormal Lab Notification  Date: 04/03/20  Name: RYKKER COVIELLO MRN:   [id number]  Re: Abnormal labs noted during PAT appointment  Provider Notified: Corky Mull, MD Notification mode: Routed and/or faxed via [place] of concern: Lab Results  Component Value Date   STAPHAUREUS POSITIVE (A) 04/03/2020   MRSAPCR NEGATIVE 04/03/2020    Notes: Patient is scheduled for a REVERSE SHOULDER ARTHROPLASTY (Right Shoulder) on 04/09/2020. This is a Community education officer; no formal response required.   Honor Loh, MSN, APRN, FNP-C, CEN [facility]  Peri-operative Services Nurse Practitioner Phone: [phone] 04/03/20 1:40 PM

## 2020-04-05 ENCOUNTER — Other Ambulatory Visit: Payer: Self-pay

## 2020-04-05 ENCOUNTER — Other Ambulatory Visit
Admission: RE | Admit: 2020-04-05 | Discharge: 2020-04-05 | Disposition: A | Discharge status: Home / Self Care | Payer: 59 | Source: Ambulatory Visit | Attending: Surgery | Admitting: Surgery

## 2020-04-05 DIAGNOSIS — Z20822 Contact with and (suspected) exposure to covid-19: Secondary | ICD-10-CM | POA: Insufficient documentation

## 2020-04-05 DIAGNOSIS — Z01812 Encounter for preprocedural laboratory examination: Secondary | ICD-10-CM | POA: Insufficient documentation

## 2020-04-06 LAB — SARS CORONAVIRUS 2 (TAT 6-24 HRS): SARS Coronavirus 2: NEGATIVE

## 2020-04-08 NOTE — Anesthesia Preprocedure Evaluation (Addendum)
Anesthesia Evaluation  Patient identified by MRN, date of birth, ID band Patient awake    Reviewed: Allergy & Precautions, H&P , NPO status , Patient's Chart, lab work & pertinent test results  History of Anesthesia Complications Negative for: history of anesthetic complications  Airway Mallampati: III  TM Distance: >3 FB Neck ROM: full   Comment: Large neck circumference Dental  (+) Teeth Intact   Pulmonary sleep apnea (probable OSA.  Has not been tested) , neg COPD, former smoker,    breath sounds clear to auscultation       Cardiovascular hypertension, (-) angina(-) Past MI and (-) Cardiac Stents (-) dysrhythmias  Rhythm:regular Rate:Normal     Neuro/Psych negative neurological ROS  negative psych ROS   GI/Hepatic GERD  Controlled,  Endo/Other  Morbid obesity  Renal/GU      Musculoskeletal   Abdominal   Peds  Hematology negative hematology ROS (+)   Anesthesia Other Findings Past Medical History: No date: Arthritis 2017: Cancer ([place])     Comment:  posterior wall of bladder No date: Carpal tunnel syndrome No date: GERD (gastroesophageal reflux disease)     Comment:  H/O No date: Hyperlipidemia No date: Hypertension No date: Pre-diabetes No date: Psoriasis No date: Right rotator cuff tear  Past Surgical History: 05/12/2017: BILATERAL CARPAL TUNNEL RELEASE; Bilateral     Comment:  Procedure: BILATERAL CARPAL TUNNEL RELEASE;  Surgeon:               Leanor Kail, MD;  Location: [facility] ORS;  Service:               Orthopedics;  Laterality: Bilateral; No date: CHOLECYSTECTOMY 04/09/2017: COLONOSCOPY WITH PROPOFOL; N/A     Comment:  Procedure: COLONOSCOPY WITH PROPOFOL;  Surgeon: Manya Silvas, MD;  Location: [facility] ENDOSCOPY;  Service:               Endoscopy;  Laterality: N/A; No date: CYSTOURETHROSCOPY No date: HERNIA REPAIR; Left     Comment:  INGUINAL 11/14/2019: TOTAL HIP  ARTHROPLASTY; Left     Comment:  Procedure: TOTAL HIP ARTHROPLASTY;  Surgeon: Corky Mull, MD;  Location: [facility] ORS;  Service: Orthopedics;                Laterality: Left; No date: TRANSURETHRAL RESECTION OF BLADDER TUMOR WITH GYRUS (TURBT- GYRUS)     Reproductive/Obstetrics negative OB ROS                           Anesthesia Physical Anesthesia Plan  ASA: III  Anesthesia Plan: General ETT   Post-op Pain Management: GA combined w/ Regional for post-op pain   Induction:   PONV Risk Score and Plan: Ondansetron, Dexamethasone, Treatment may vary due to age or medical condition and Midazolam  Airway Management Planned:   Additional Equipment:   Intra-op Plan:   Post-operative Plan:   Informed Consent: I have reviewed the patients History and Physical, chart, labs and discussed the procedure including the risks, benefits and alternatives for the proposed anesthesia with the patient or authorized representative who has indicated his/her understanding and acceptance.     Dental Advisory Given  Plan Discussed with: Anesthesiologist, CRNA and Surgeon  Anesthesia Plan Comments: (Discussed risks and benefits of interscalene block, including possible need for supplemental oxygen post op given  suspected OSA and h/o smoking.  Pt understands and still wishes to proceed.)      Anesthesia Quick Evaluation

## 2020-04-09 ENCOUNTER — Encounter: Payer: Self-pay | Admitting: Surgery

## 2020-04-09 ENCOUNTER — Ambulatory Visit: Payer: 59

## 2020-04-09 ENCOUNTER — Ambulatory Visit: Payer: 59 | Admitting: Anesthesiology

## 2020-04-09 ENCOUNTER — Other Ambulatory Visit: Payer: Self-pay

## 2020-04-09 ENCOUNTER — Encounter
Admission: RE | Disposition: A | Discharge status: Home / Self Care | Payer: Self-pay | Source: Home / Self Care | Attending: Surgery

## 2020-04-09 ENCOUNTER — Ambulatory Visit
Admission: RE | Admit: 2020-04-09 | Discharge: 2020-04-09 | Disposition: A | Discharge status: Home / Self Care | Payer: 59 | Attending: Surgery | Admitting: Surgery

## 2020-04-09 DIAGNOSIS — L409 Psoriasis, unspecified: Secondary | ICD-10-CM | POA: Diagnosis not present

## 2020-04-09 DIAGNOSIS — Z419 Encounter for procedure for purposes other than remedying health state, unspecified: Secondary | ICD-10-CM

## 2020-04-09 DIAGNOSIS — Z96642 Presence of left artificial hip joint: Secondary | ICD-10-CM | POA: Insufficient documentation

## 2020-04-09 DIAGNOSIS — E7800 Pure hypercholesterolemia, unspecified: Secondary | ICD-10-CM | POA: Diagnosis not present

## 2020-04-09 DIAGNOSIS — Z96611 Presence of right artificial shoulder joint: Secondary | ICD-10-CM

## 2020-04-09 DIAGNOSIS — Z79899 Other long term (current) drug therapy: Secondary | ICD-10-CM | POA: Insufficient documentation

## 2020-04-09 DIAGNOSIS — G4730 Sleep apnea, unspecified: Secondary | ICD-10-CM | POA: Diagnosis not present

## 2020-04-09 DIAGNOSIS — Z9049 Acquired absence of other specified parts of digestive tract: Secondary | ICD-10-CM | POA: Diagnosis not present

## 2020-04-09 DIAGNOSIS — E1140 Type 2 diabetes mellitus with diabetic neuropathy, unspecified: Secondary | ICD-10-CM | POA: Insufficient documentation

## 2020-04-09 DIAGNOSIS — Z8551 Personal history of malignant neoplasm of bladder: Secondary | ICD-10-CM | POA: Insufficient documentation

## 2020-04-09 DIAGNOSIS — Z6841 Body Mass Index (BMI) 40.0 and over, adult: Secondary | ICD-10-CM | POA: Diagnosis not present

## 2020-04-09 DIAGNOSIS — Z87891 Personal history of nicotine dependence: Secondary | ICD-10-CM | POA: Diagnosis not present

## 2020-04-09 DIAGNOSIS — M75121 Complete rotator cuff tear or rupture of right shoulder, not specified as traumatic: Secondary | ICD-10-CM | POA: Diagnosis present

## 2020-04-09 DIAGNOSIS — K219 Gastro-esophageal reflux disease without esophagitis: Secondary | ICD-10-CM | POA: Diagnosis not present

## 2020-04-09 DIAGNOSIS — I10 Essential (primary) hypertension: Secondary | ICD-10-CM | POA: Insufficient documentation

## 2020-04-09 HISTORY — PX: REVERSE SHOULDER ARTHROPLASTY: SHX5054

## 2020-04-09 SURGERY — ARTHROPLASTY, SHOULDER, TOTAL, REVERSE
Anesthesia: General | Site: Shoulder | Laterality: Right

## 2020-04-09 MED ORDER — ONDANSETRON HCL 4 MG/2ML IJ SOLN
INTRAMUSCULAR | Status: AC
  Filled 2020-04-09: qty 2

## 2020-04-09 MED ORDER — OXYCODONE HCL 5 MG/5ML PO SOLN: 5.0000 mg | Freq: Once | ORAL | Status: AC | PRN

## 2020-04-09 MED ORDER — PROPOFOL 10 MG/ML IV BOLUS
INTRAVENOUS | Status: AC
  Filled 2020-04-09: qty 20

## 2020-04-09 MED ORDER — OXYCODONE HCL 5 MG PO TABS
5.0000 mg | ORAL_TABLET | Freq: Once | ORAL | Status: AC | PRN
  Administered 2020-04-09: 5 mg via ORAL

## 2020-04-09 MED ORDER — MIDAZOLAM HCL 2 MG/2ML IJ SOLN: 1.0000 mg | INTRAMUSCULAR | Status: DC | PRN

## 2020-04-09 MED ORDER — SUGAMMADEX SODIUM 500 MG/5ML IV SOLN
INTRAVENOUS | Status: DC | PRN
  Administered 2020-04-09: 300 mg via INTRAVENOUS

## 2020-04-09 MED ORDER — PROPOFOL 10 MG/ML IV BOLUS
INTRAVENOUS | Status: DC | PRN
  Administered 2020-04-09: 150 mg via INTRAVENOUS
  Administered 2020-04-09: 30 mg via INTRAVENOUS
  Administered 2020-04-09: 20 mg via INTRAVENOUS
  Administered 2020-04-09: 50 mg via INTRAVENOUS
  Administered 2020-04-09 (×2): 20 mg via INTRAVENOUS

## 2020-04-09 MED ORDER — DEXAMETHASONE SODIUM PHOSPHATE 10 MG/ML IJ SOLN
INTRAMUSCULAR | Status: DC | PRN
  Administered 2020-04-09: 10 mg via INTRAVENOUS

## 2020-04-09 MED ORDER — TRANEXAMIC ACID 1000 MG/10ML IV SOLN
INTRAVENOUS | Status: DC | PRN
  Administered 2020-04-09: 1000 mg via INTRAVENOUS

## 2020-04-09 MED ORDER — ONDANSETRON HCL 4 MG/2ML IJ SOLN
4.0000 mg | Freq: Once | INTRAMUSCULAR | Status: AC | PRN
  Administered 2020-04-09: 4 mg via INTRAVENOUS

## 2020-04-09 MED ORDER — KETOROLAC TROMETHAMINE 30 MG/ML IJ SOLN: 30.0000 mg | Freq: Once | INTRAMUSCULAR | Status: AC

## 2020-04-09 MED ORDER — SODIUM CHLORIDE 0.9 % IV BOLUS
250.0000 mL | Freq: Once | INTRAVENOUS | Status: AC
  Administered 2020-04-09: 250 mL via INTRAVENOUS

## 2020-04-09 MED ORDER — DEXTROSE 5 % IV SOLN
3.0000 g | INTRAVENOUS | Status: AC
  Administered 2020-04-09: 3 g via INTRAVENOUS
  Filled 2020-04-09: qty 3

## 2020-04-09 MED ORDER — SODIUM CHLORIDE FLUSH 0.9 % IV SOLN
INTRAVENOUS | Status: AC
  Filled 2020-04-09: qty 40

## 2020-04-09 MED ORDER — ONDANSETRON HCL 4 MG/2ML IJ SOLN
INTRAMUSCULAR | Status: AC
  Administered 2020-04-09: 4 mg via INTRAVENOUS
  Filled 2020-04-09: qty 2

## 2020-04-09 MED ORDER — SUCCINYLCHOLINE CHLORIDE 20 MG/ML IJ SOLN
INTRAMUSCULAR | Status: DC | PRN
  Administered 2020-04-09: 140 mg via INTRAVENOUS

## 2020-04-09 MED ORDER — BUPIVACAINE HCL (PF) 0.5 % IJ SOLN
INTRAMUSCULAR | Status: AC
  Filled 2020-04-09: qty 10

## 2020-04-09 MED ORDER — OXYCODONE HCL 5 MG PO TABS
ORAL_TABLET | ORAL | Status: AC
  Filled 2020-04-09: qty 1

## 2020-04-09 MED ORDER — FENTANYL CITRATE (PF) 100 MCG/2ML IJ SOLN
INTRAMUSCULAR | Status: AC
  Filled 2020-04-09: qty 2

## 2020-04-09 MED ORDER — MIDAZOLAM HCL 2 MG/2ML IJ SOLN
INTRAMUSCULAR | Status: AC
  Administered 2020-04-09: 1 mg via INTRAVENOUS
  Filled 2020-04-09: qty 2

## 2020-04-09 MED ORDER — BUPIVACAINE-EPINEPHRINE (PF) 0.5% -1:200000 IJ SOLN
INTRAMUSCULAR | Status: DC | PRN
  Administered 2020-04-09: 30 mL via PERINEURAL

## 2020-04-09 MED ORDER — ORAL CARE MOUTH RINSE: 15.0000 mL | Freq: Once | OROMUCOSAL | Status: AC

## 2020-04-09 MED ORDER — OXYCODONE HCL 5 MG PO TABS: 5.0000 mg | ORAL_TABLET | ORAL | 0 refills | Status: DC | PRN

## 2020-04-09 MED ORDER — LIDOCAINE HCL (CARDIAC) PF 100 MG/5ML IV SOSY
PREFILLED_SYRINGE | INTRAVENOUS | Status: DC | PRN
  Administered 2020-04-09: 100 mg via INTRAVENOUS

## 2020-04-09 MED ORDER — ROCURONIUM BROMIDE 100 MG/10ML IV SOLN
INTRAVENOUS | Status: DC | PRN
  Administered 2020-04-09: 30 mg via INTRAVENOUS
  Administered 2020-04-09 (×2): 20 mg via INTRAVENOUS

## 2020-04-09 MED ORDER — LIDOCAINE HCL (PF) 2 % IJ SOLN
INTRAMUSCULAR | Status: AC
  Filled 2020-04-09: qty 5

## 2020-04-09 MED ORDER — DEXTROSE 5 % IV SOLN
3.0000 g | Freq: Four times a day (QID) | INTRAVENOUS | Status: DC
  Administered 2020-04-09: 3 g via INTRAVENOUS
  Filled 2020-04-09: qty 3
  Filled 2020-04-09 (×3): qty 3000

## 2020-04-09 MED ORDER — BUPIVACAINE LIPOSOME 1.3 % IJ SUSP
INTRAMUSCULAR | Status: AC
  Filled 2020-04-09: qty 20

## 2020-04-09 MED ORDER — ACETAMINOPHEN 10 MG/ML IV SOLN
INTRAVENOUS | Status: DC | PRN
  Administered 2020-04-09: 1000 mg via INTRAVENOUS

## 2020-04-09 MED ORDER — CHLORHEXIDINE GLUCONATE 0.12 % MT SOLN: 15.0000 mL | Freq: Once | OROMUCOSAL | Status: AC

## 2020-04-09 MED ORDER — LACTATED RINGERS IV SOLN: INTRAVENOUS | Status: DC

## 2020-04-09 MED ORDER — KETOROLAC TROMETHAMINE 30 MG/ML IJ SOLN
INTRAMUSCULAR | Status: AC
  Administered 2020-04-09: 30 mg via INTRAVENOUS
  Filled 2020-04-09: qty 1

## 2020-04-09 MED ORDER — PHENYLEPHRINE HCL-NACL 10-0.9 MG/250ML-% IV SOLN
INTRAVENOUS | Status: DC | PRN
  Administered 2020-04-09: 50 ug/min via INTRAVENOUS

## 2020-04-09 MED ORDER — ACETAMINOPHEN 500 MG PO TABS: 1000.0000 mg | ORAL_TABLET | Freq: Four times a day (QID) | ORAL | Status: DC

## 2020-04-09 MED ORDER — METOCLOPRAMIDE HCL 5 MG/ML IJ SOLN: 5.0000 mg | Freq: Three times a day (TID) | INTRAMUSCULAR | Status: DC | PRN

## 2020-04-09 MED ORDER — ONDANSETRON HCL 4 MG PO TABS: 4.0000 mg | ORAL_TABLET | Freq: Four times a day (QID) | ORAL | Status: DC | PRN

## 2020-04-09 MED ORDER — PHENYLEPHRINE HCL (PRESSORS) 10 MG/ML IV SOLN
INTRAVENOUS | Status: DC | PRN
  Administered 2020-04-09 (×2): 200 ug via INTRAVENOUS

## 2020-04-09 MED ORDER — EPINEPHRINE PF 1 MG/ML IJ SOLN
INTRAMUSCULAR | Status: AC
  Filled 2020-04-09: qty 1

## 2020-04-09 MED ORDER — CHLORHEXIDINE GLUCONATE 0.12 % MT SOLN
OROMUCOSAL | Status: AC
Start: 2020-04-09 — End: 2020-04-09
  Administered 2020-04-09: 15 mL via OROMUCOSAL
  Filled 2020-04-09: qty 15

## 2020-04-09 MED ORDER — FENTANYL CITRATE (PF) 100 MCG/2ML IJ SOLN: 25.0000 ug | INTRAMUSCULAR | Status: DC | PRN

## 2020-04-09 MED ORDER — ROCURONIUM BROMIDE 10 MG/ML (PF) SYRINGE
PREFILLED_SYRINGE | INTRAVENOUS | Status: AC
  Filled 2020-04-09: qty 10

## 2020-04-09 MED ORDER — ACETAMINOPHEN 10 MG/ML IV SOLN
INTRAVENOUS | Status: AC
  Filled 2020-04-09: qty 100

## 2020-04-09 MED ORDER — ONDANSETRON HCL 4 MG/2ML IJ SOLN: 4.0000 mg | Freq: Once | INTRAMUSCULAR | Status: AC

## 2020-04-09 MED ORDER — OXYCODONE HCL 5 MG PO TABS
5.0000 mg | ORAL_TABLET | ORAL | Status: DC | PRN
  Filled 2020-04-09: qty 2

## 2020-04-09 MED ORDER — METOCLOPRAMIDE HCL 10 MG PO TABS: 5.0000 mg | ORAL_TABLET | Freq: Three times a day (TID) | ORAL | Status: DC | PRN

## 2020-04-09 MED ORDER — FAMOTIDINE 20 MG PO TABS: 20.0000 mg | ORAL_TABLET | Freq: Once | ORAL | Status: AC

## 2020-04-09 MED ORDER — FENTANYL CITRATE (PF) 100 MCG/2ML IJ SOLN
INTRAMUSCULAR | Status: AC
  Administered 2020-04-09: 50 ug via INTRAVENOUS
  Filled 2020-04-09: qty 2

## 2020-04-09 MED ORDER — ONDANSETRON HCL 4 MG/2ML IJ SOLN
INTRAMUSCULAR | Status: DC | PRN
  Administered 2020-04-09: 4 mg via INTRAVENOUS

## 2020-04-09 MED ORDER — FAMOTIDINE 20 MG PO TABS
ORAL_TABLET | ORAL | Status: AC
  Administered 2020-04-09: 20 mg via ORAL
  Filled 2020-04-09: qty 1

## 2020-04-09 MED ORDER — BUPIVACAINE HCL (PF) 0.5 % IJ SOLN
INTRAMUSCULAR | Status: AC
  Filled 2020-04-09: qty 30

## 2020-04-09 MED ORDER — TRANEXAMIC ACID 1000 MG/10ML IV SOLN
INTRAVENOUS | Status: AC
  Filled 2020-04-09: qty 10

## 2020-04-09 MED ORDER — LIDOCAINE HCL (PF) 1 % IJ SOLN
INTRAMUSCULAR | Status: AC
  Filled 2020-04-09: qty 5

## 2020-04-09 MED ORDER — FENTANYL CITRATE (PF) 100 MCG/2ML IJ SOLN
50.0000 ug | INTRAMUSCULAR | Status: AC | PRN
  Administered 2020-04-09: 100 ug via INTRAVENOUS

## 2020-04-09 MED ORDER — POTASSIUM CHLORIDE IN NACL 20-0.9 MEQ/L-% IV SOLN
INTRAVENOUS | Status: DC
  Filled 2020-04-09: qty 1000

## 2020-04-09 MED ORDER — KETOROLAC TROMETHAMINE 15 MG/ML IJ SOLN: 15.0000 mg | Freq: Four times a day (QID) | INTRAMUSCULAR | Status: DC

## 2020-04-09 MED ORDER — ONDANSETRON HCL 4 MG/2ML IJ SOLN: 4.0000 mg | Freq: Four times a day (QID) | INTRAMUSCULAR | Status: DC | PRN

## 2020-04-09 SURGICAL SUPPLY — 74 items
APL PRP STRL LF DISP 70% ISPRP (MISCELLANEOUS) ×2
BASEPLATE GLENOSPHERE 25 (Plate) ×2 IMPLANT
BASEPLATE GLENOSPHERE 25MM (Plate) ×1 IMPLANT
BEARING HUMERAL 40 STD VITE (Joint) ×3 IMPLANT
BIT DRILL TWIST 2.7 (BIT) ×2 IMPLANT
BIT DRILL TWIST 2.7MM (BIT) ×1
BLADE SAW SAG 25X90X1.19 (BLADE) ×3 IMPLANT
BRNG HUM STD 40 RVRS SHLDR (Joint) ×1 IMPLANT
CANISTER SUCT 1200ML W/VALVE (MISCELLANEOUS) ×3 IMPLANT
CANISTER SUCT 3000ML PPV (MISCELLANEOUS) ×6 IMPLANT
CHLORAPREP W/TINT 26 (MISCELLANEOUS) ×6 IMPLANT
COOLER POLAR GLACIER W/PUMP (MISCELLANEOUS) ×3 IMPLANT
COVER BACK TABLE REUSABLE LG (DRAPES) ×3 IMPLANT
COVER WAND RF STERILE (DRAPES) ×3 IMPLANT
DIAL VERSA SHOULDER 40 STD (Joint) ×3 IMPLANT
DRAPE 3/4 80X56 (DRAPES) ×6 IMPLANT
DRAPE IMP U-DRAPE 54X76 (DRAPES) ×6 IMPLANT
DRAPE INCISE IOBAN 66X45 STRL (DRAPES) ×6 IMPLANT
DRSG OPSITE POSTOP 4X10 (GAUZE/BANDAGES/DRESSINGS) ×3 IMPLANT
DRSG OPSITE POSTOP 4X8 (GAUZE/BANDAGES/DRESSINGS) ×3 IMPLANT
ELECT BLADE 6.5 EXT (BLADE) IMPLANT
ELECT CAUTERY BLADE 6.4 (BLADE) ×3 IMPLANT
GLOVE BIO SURGEON STRL SZ7.5 (GLOVE) ×12 IMPLANT
GLOVE BIO SURGEON STRL SZ8 (GLOVE) ×15 IMPLANT
GLOVE BIOGEL PI IND STRL 8 (GLOVE) ×2 IMPLANT
GLOVE BIOGEL PI INDICATOR 8 (GLOVE) ×4
GLOVE INDICATOR 8.0 STRL GRN (GLOVE) ×3 IMPLANT
GOWN STRL REUS W/ TWL LRG LVL3 (GOWN DISPOSABLE) ×3 IMPLANT
GOWN STRL REUS W/ TWL XL LVL3 (GOWN DISPOSABLE) ×1 IMPLANT
GOWN STRL REUS W/TWL LRG LVL3 (GOWN DISPOSABLE) ×9
GOWN STRL REUS W/TWL XL LVL3 (GOWN DISPOSABLE) ×3
HOOD PEEL AWAY FLYTE STAYCOOL (MISCELLANEOUS) ×12 IMPLANT
ILLUMINATOR WAVEGUIDE N/F (MISCELLANEOUS) ×6 IMPLANT
KIT STABILIZATION SHOULDER (MISCELLANEOUS) ×3 IMPLANT
KIT TURNOVER KIT A (KITS) ×3 IMPLANT
MASK FACE SPIDER DISP (MASK) ×3 IMPLANT
MAT ABSORB  FLUID 56X50 GRAY (MISCELLANEOUS) ×2
MAT ABSORB FLUID 56X50 GRAY (MISCELLANEOUS) ×1 IMPLANT
NDL SAFETY ECLIPSE 18X1.5 (NEEDLE) ×1 IMPLANT
NEEDLE HYPO 18GX1.5 SHARP (NEEDLE) ×3
NEEDLE HYPO 22GX1.5 SAFETY (NEEDLE) ×3 IMPLANT
NEEDLE SPNL 20GX3.5 QUINCKE YW (NEEDLE) ×3 IMPLANT
NS IRRIG 500ML POUR BTL (IV SOLUTION) ×3 IMPLANT
PACK SHDR ARTHRO (MISCELLANEOUS) ×3 IMPLANT
PAD ARMBOARD 7.5X6 YLW CONV (MISCELLANEOUS) ×3 IMPLANT
PAD WRAPON POLAR SHDR UNIV (MISCELLANEOUS) IMPLANT
PAD WRAPON POLAR SHDR XLG (MISCELLANEOUS) ×1 IMPLANT
PENCIL SMOKE EVACUATOR (MISCELLANEOUS) ×3 IMPLANT
PIN THREADED REVERSE (PIN) ×3 IMPLANT
PULSAVAC PLUS IRRIG FAN TIP (DISPOSABLE) ×3
SCREW BONE LOCKING 4.75X30X3.5 (Screw) ×3 IMPLANT
SCREW BONE LOCKING 4.75X35X3.5 (Screw) ×3 IMPLANT
SCREW BONE STRL 6.5MMX30MM (Screw) ×3 IMPLANT
SCREW LOCKING NS 4.75MMX20MM (Screw) ×6 IMPLANT
SLING ULTRA II M (MISCELLANEOUS) ×3 IMPLANT
SOL .9 NS 3000ML IRR  AL (IV SOLUTION) ×2
SOL .9 NS 3000ML IRR AL (IV SOLUTION) ×1
SOL .9 NS 3000ML IRR UROMATIC (IV SOLUTION) ×1 IMPLANT
SPONGE LAP 18X18 RF (DISPOSABLE) ×3 IMPLANT
STAPLER SKIN PROX 35W (STAPLE) ×3 IMPLANT
STEM SHOULDER 17MMX55MM LONG (Stem) ×3 IMPLANT
SUT ETHIBOND 0 MO6 C/R (SUTURE) ×3 IMPLANT
SUT FIBERWIRE #2 38 BLUE 1/2 (SUTURE) ×12
SUT VIC AB 0 CT1 36 (SUTURE) ×3 IMPLANT
SUT VIC AB 2-0 CT1 27 (SUTURE) ×9
SUT VIC AB 2-0 CT1 TAPERPNT 27 (SUTURE) ×3 IMPLANT
SUTURE FIBERWR #2 38 BLUE 1/2 (SUTURE) ×4 IMPLANT
SYR 10ML LL (SYRINGE) ×3 IMPLANT
SYR 30ML LL (SYRINGE) IMPLANT
SYR BULB IRRIG 60ML STRL (SYRINGE) ×3 IMPLANT
TIP FAN IRRIG PULSAVAC PLUS (DISPOSABLE) ×1 IMPLANT
TRAY HUM REV SHOULDER STD +6 (Shoulder) ×3 IMPLANT
WRAPON POLAR PAD SHDR UNIV (MISCELLANEOUS)
WRAPON POLAR PAD SHDR XLG (MISCELLANEOUS) ×3

## 2020-04-09 NOTE — Discharge Instructions (Addendum)
Orthopedic discharge instructions: May shower with intact op-site dressing. Apply ice frequently to shoulder. Take ibuprofen 800 mg TID with meals for 7-10 days, then as necessary. Take oxycodone as prescribed when needed.  May supplement with ES Tylenol if necessary. Keep shoulder immobilizer on at all times except may remove for bathing purposes. Follow-up in 10-14 days or as scheduled.      Interscalene Nerve Block with Exparel  1.  For your surgery you have received an Interscalene Nerve Block with Exparel. 2. Nerve Blocks affect many types of nerves, including nerves that control movement, pain and normal sensation.  You may experience feelings such as numbness, tingling, heaviness, weakness or the inability to move your arm or the feeling or sensation that your arm has "fallen asleep". 3. A nerve block with Exparel can last up to 5 days.  Usually the weakness wears off first.  The tingling and heaviness usually wear off next.  Finally you may start to notice pain.  Keep in mind that this may occur in any order.  Once a nerve block starts to wear off it is usually completely gone within 60 minutes. 4. ISNB may cause mild shortness of breath, a hoarse voice, blurry vision, unequal pupils, or drooping of the face on the same side as the nerve block.  These symptoms will usually resolve with the numbness.  Very rarely the procedure itself can cause mild seizures. 5. If needed, your surgeon will give you a prescription for pain medication.  It will take about 60 minutes for the oral pain medication to become fully effective.  So, it is recommended that you start taking this medication before the nerve block first begins to wear off, or when you first begin to feel discomfort. 6. Take your pain medication only as prescribed.  Pain medication can cause sedation and decrease your breathing if you take more than you need for the level of pain that you have. 7. Nausea is a common side effect of many  pain medications.  You may want to eat something before taking your pain medicine to prevent nausea. 8. After an Interscalene nerve block, you cannot feel pain, pressure or extremes in temperature in the effected arm.  Because your arm is numb it is at an increased risk for injury.  To decrease the possibility of injury, please practice the following:  a. While you are awake change the position of your arm frequently to prevent too much pressure on any one area for prolonged periods of time. b.  If you have a cast or tight dressing, check the color or your fingers every couple of hours.  Call your surgeon with the appearance of any discoloration (white or blue). c. If you are given a sling to wear before you go home, please wear it  at all times until the block has completely worn off.  Do not get up at night without your sling. d. Please contact [facility] or your surgeon if you do not begin to regain sensation after 7 days from the surgery.  Anesthesia may be contacted by calling the Same Day Surgery Department, Mon. through Fri., 6 am to 4 pm at [phone].   e. If you experience any other problems or concerns, please contact your surgeon's office. f. If you experience severe or prolonged shortness of breath go to the nearest emergency department.     AMBULATORY SURGERY  DISCHARGE INSTRUCTIONS   1) The drugs that you were given will stay in your system  until tomorrow so for the next 24 hours you should not:  A) Drive an automobile B) Make any legal decisions C) Drink any alcoholic beverage   2) You may resume regular meals tomorrow.  Today it is better to start with liquids and gradually work up to solid foods.  You may eat anything you prefer, but it is better to start with liquids, then soup and crackers, and gradually work up to solid foods.   3) Please notify your doctor immediately if you have any unusual bleeding, trouble breathing, redness and pain at the surgery site,  drainage, fever, or pain not relieved by medication.    4) Additional Instructions:        Please contact your physician with any problems or Same Day Surgery at [phone], Monday through Friday 6 am to 4 pm, or [place] at [employer] number at [phone].

## 2020-04-09 NOTE — Op Note (Signed)
04/09/2020  10:37 AM  Patient:   Patrick Mason  Pre-Op Diagnosis:   Massive irreparable rotator cuff tear with cuff arthropathy, right shoulder.  Post-Op Diagnosis:   Same  Procedure:   Reverse right total shoulder arthroplasty.  Surgeon:   Pascal Lux, MD  Assistant:   Cameron Proud, PA-C; Erenest Blank, PA-S  Anesthesia:   General endotracheal with an interscalene block using Exparel placed preoperatively by the anesthesiologist.  Findings:   As above.  Complications:   None  EBL:    200 cc  Fluids:   750 cc crystalloid  UOP:   None  TT:   None  Drains:   None  Closure:   Staples  Implants:   All press-fit Biomet Comprehensive system with a #17 micro-humeral stem, a +6 mm lateralized 40 mm humeral tray with a standard insert, and a mini-base plate with a 40 mm glenosphere.  Brief Clinical Note:   The patient is a 64 year old male with a long history of progressively worsening pain and weakness of his right shoulder. His symptoms have progressed despite medications, activity modification, etc. His history and examination are consistent with a massive irreparable rotator cuff tear with cuff arthropathy, all of which were confirmed by a preoperative CT scan. The patient presents at this time for a reverse right total shoulder arthroplasty.  Procedure:   The patient underwent placement of an interscalene block using Exparel by the anesthesiologist in the preoperative holding area before being brought into the operating room and lain in the supine position. The patient then underwent general endotracheal intubation and anesthesia before the patient was repositioned in the beach chair position using the beach chair positioner. The right shoulder and upper extremity were prepped with ChloraPrep solution before being draped sterilely. Preoperative antibiotics were administered.   A standard anterior approach to the shoulder was made through an approximately 4-5 inch incision.  The incision was carried down through the subcutaneous tissues to expose the deltopectoral fascia. The interval between the deltoid and pectoralis muscles was identified and this plane developed, retracting the cephalic vein laterally with the deltoid muscle. The conjoined tendon was identified. Its lateral margin was dissected and the Kolbel self-retraining retractor inserted. The "three sisters" were identified and cauterized. Bursal tissues were removed to improve visualization. The subscapularis tendon remnant was released from its attachment to the lesser tuberosity 1 cm proximal to its insertion and several tagging sutures placed. The inferior capsule was released with care after identifying and protecting the axillary nerve. The proximal humeral cut was made at approximately 25 of retroversion using the extra-medullary guide.   Attention was redirected to the glenoid. The labrum was debrided circumferentially before the center of the glenoid was marked with electrocautery. The guidewire was drilled into the glenoid neck using the appropriate guide. After verifying its position, it was overreamed with the mini-baseplate reamer to create a flat surface. The permanent mini-baseplate was impacted into place. It was stabilized with a 30 x 6.5 mm central screw and four peripheral locking screws. The permanent 40 mm glenosphere was then impacted into place and its Morse taper locking mechanism verified using manual distraction.  Attention was directed to the humeral side. The humeral canal was reamed sequentially beginning with the end-cutting reamer then progressing from a 4 mm reamer up to a 17 mm reamer. This provided excellent circumferential chatter. The canal was broached beginning with a #15 broach and progressing to a #17 broach. This was left in place and an attempted  trial reduction performed using the standard and +6 lateralized trial humeral platform.  However, the shoulder was still too tight to  allow reduction. Therefore, the trial components were removed and the humeral neck recut, removing approximately 5 more millimeters of bone. The canal was rebroached and still accommodated a 17 mm stem.    A repeat trial reduction with the +6 lateralized trial humeral platform enabled the shoulder to be reduced and demonstrated excellent range of motion as the hand could be brought across the chest to the opposite shoulder and brought to the top of the patient's head and to the patient's ear. The shoulder appeared stable throughout this range of motion. The joint was dislocated and the trial components removed. The permanent #17 micro-stem was impacted into place with care taken to maintain the appropriate version. The permanent +6 mm lateralized 40 mm humeral platform with the standard insert was put together on the back table and impacted into place. Again, the [facility] taper locking mechanism was verified using manual distraction. The shoulder was relocated using two finger pressure and again placed through a range of motion with the findings as described above.  The wound was copiously irrigated with sterile saline solution using the jet lavage system before a total of 30 cc of 0.5% Sensorcaine with epinephrine was injected into the pericapsular and peri-incisional tissues to help with postoperative analgesia. The subscapularis tendon was assessed and it was elected not to repair the subscapularis remnants as it would be under undue tension, thereby potentially restricting shoulder motion. The deltopectoral interval was closed using #0 Vicryl interrupted sutures before the subcutaneous tissues were closed using 2-0 Vicryl interrupted sutures. The skin was closed using staples. Prior to closing the skin, 1 g of transexemic acid in 10 cc of normal saline was injected intra-articularly to help with postoperative bleeding. A sterile occlusive dressing was applied to the wound before the arm was placed into a  shoulder immobilizer with an abduction pillow. A Polar Care system also was applied to the shoulder. The patient was then transferred back to a hospital bed before being awakened, extubated, and returned to the recovery room in satisfactory condition after tolerating the procedure well.

## 2020-04-09 NOTE — Transfer of Care (Signed)
Immediate Anesthesia Transfer of Care Note  Patient: Patrick Mason  Procedure(s) Performed: REVERSE SHOULDER ARTHROPLASTY (Right Shoulder)  Patient Location: PACU  Anesthesia Type:General and Regional  Level of Consciousness: awake, alert  and oriented  Airway & Oxygen Therapy: Patient Spontanous Breathing and Patient connected to face mask oxygen  Post-op Assessment: Report given to RN and Post -op Vital signs reviewed and stable  Post vital signs: stable  Last Vitals:  Vitals Value Taken Time  BP 154/68 04/09/20 1049  Temp    Pulse 102 04/09/20 1052  Resp 18 04/09/20 1052  SpO2 94 % 04/09/20 1052  Vitals shown include unvalidated device data.  Last Pain:  Vitals:   04/09/20 1051  TempSrc:   PainSc: (P) 0-No pain         Complications: No complications documented.

## 2020-04-09 NOTE — Anesthesia Procedure Notes (Signed)
Procedure Name: Intubation Date/Time: 04/09/2020 7:45 AM Performed by: Natasha Mead, CRNA Pre-anesthesia Checklist: Patient identified, Emergency Drugs available, Suction available and Patient being monitored Patient Re-evaluated:Patient Re-evaluated prior to induction Oxygen Delivery Method: Circle system utilized Preoxygenation: Pre-oxygenation with 100% oxygen Induction Type: IV induction Ventilation: Oral airway inserted - appropriate to patient size and Mask ventilation with difficulty Laryngoscope Size: McGraph and 4 Grade View: Grade I Tube type: Oral Tube size: 7.5 mm Number of attempts: 1 Airway Equipment and Method: Stylet and Oral airway Placement Confirmation: ETT inserted through vocal cords under direct vision,  positive ETCO2 and breath sounds checked- equal and bilateral Secured at: 21 cm Tube secured with: Tape Dental Injury: Teeth and Oropharynx as per pre-operative assessment

## 2020-04-09 NOTE — H&P (Addendum)
History of Present Illness: Patrick Mason is a 64 y.o. who present today for history and physical. He is to undergo a right reverse total shoulder arthroplasty on 04/09/2020. Last seen in clinic on 02/12/2020. There is been no change in his condition since that time.  The patient began having problems with his right shoulder over 2 and half years ago when he was seen here in the clinic. At that time was noted to have a chronic massive irreparable rotator cuff tear with advanced cuff arthropathy. Recently has been having problem with the left hip and subsequently underwent total hip arthroplasty. However he presents today for evaluation of the left shoulder. Patient feels that his right shoulder symptoms have progressively worsened over the last year. He denies any injuries to the shoulder. Also denies any numbness, tingling or burning sensation to the upper extremity. States is not really having a lot of discomfort but more of limitation with motion which is interfering with his activities of daily living.  The patient states that his symptoms have increased to the point where they are interfering with his activities of daily living and wishes to proceed with surgery at this time.  Past Medical History: . At risk for sleep apnea 10/06/2016  . BMI 39.0-39.9,adult 12/07/2016  . Cancer (CMS-HCC) MARCH 2017  BLADDER  . Carpal tunnel syndrome of left wrist 12/07/2016  . Controlled type 2 diabetes mellitus without complication, without long-term current use of insulin (CMS-HCC) 12/07/2016  . Environmental allergies  . Essential hypertension 10/06/2016  . Hypercholesterolemia 08/09/2018  . Hypertension  SEVERAL YEARS  . Malignant neoplasm of posterior wall of urinary bladder (CMS-HCC) 09/18/2016  . Morbid obesity (CMS-HCC) 05/04/2017  . Polyneuropathy 04/28/2017  . Primary osteoarthritis of left hip  . Psoriasis  . Rotator cuff tendinitis, right 08/06/2017   Past Surgical History: . CHOLECYSTECTOMY 2003  .  COLONOSCOPY 04/09/2017  Sessile Serrated Adenoma: CBF 03/2022  . CYSTOURETHROSCOPY W/FULGURATION &/OR RESECTION BLADDER TUMOR N/A 10/12/2016  Procedure: CYSTOURETHROSCOPY, WITH FULGURATION AND/OR RESECTION OF; MEDIUM BLADDER TUMOR(S) (2.0 TO 5.0 CM); Surgeon: Alroy Dust, MD; Location: [place]; Service: Urology; Laterality: N/A; Bilateral retrograde pyelograms  . CYSTOURETHROSCOPY W/FULGURATION &/OR RESECTION BLADDER TUMOR N/A 09/22/2017  Procedure: CYSTOURETHROSCOPY, WITH FULGURATION AND/OR RESECTION OF; MEDIUM BLADDER TUMOR(S) (2.0 TO 5.0 CM) - [facility]; Surgeon: Alroy Dust, MD; Location: [place]; Service: Urology; Laterality: N/A;  . ENDOSCOPIC CARPAL TUNNEL RELEASE Bilateral 05/12/2017  Dr. Jefm Bryant  . HERNIA REPAIR 1979  . Left total hip arthroplasty Left 11/14/2019  Dr.Derrek Puff  . SKIN BIOPSY  . TURBT  multiple, last 07/2016  . URETEROGRAM RETROGRADE W/WO KUB Bilateral 10/12/2016  Procedure: UROGRAPHY, RETROGRADE, WITH OR WITHOUT KUB; Surgeon: Alroy Dust, MD; Location: [place]; Service: Urology; Laterality: Bilateral;  . URETEROGRAM RETROGRADE W/WO KUB Bilateral 09/22/2017  Procedure: UROGRAPHY, RETROGRADE, WITH OR WITHOUT KUB - Selective Cytology; Surgeon: Alroy Dust, MD; Location: [place]; Service: Urology; Laterality: Bilateral;   Past Family History: . Anesthesia problems Neg Hx  . Malignant hyperthermia Neg Hx   Medications: . acetaminophen (TYLENOL) 500 MG tablet Take by mouth  . atorvastatin (LIPITOR) 20 MG tablet Take 1 tablet (20 mg total) by mouth once daily 90 tablet 3  . clobetasoL (TEMOVATE) 0.05 % ointment Apply topically 2 (two) times daily For body not face. Should not be used continuously for more than 2 weeks. 60 g 2  . fluticasone (FLONASE) 50 mcg/actuation nasal spray Place 2 sprays into both  nostrils once daily as needed for Rhinitis  . guaiFENesin 1,200 mg Ta12 Take 1 tablet by mouth once daily as needed  .  olmesartan-hydrochlorothiazide (BENICAR HCT) 20-12.5 mg tablet Take 1 tablet by mouth once daily 90 tablet 3   No current Epic-ordered facility-administered medications on file.   Allergies: No Known Allergies   Review of Systems: A comprehensive 14 point ROS was performed, reviewed, and the pertinent orthopaedic findings are documented in the HPI.  Physical Exam: BP 130/86  Ht 175.3 cm (5' 9.02")  Wt (!) 125 kg (275 lb 9.6 oz)  BMI 40.68 kg/m   General: Well-developed well-nourished male seen in no acute distress.   HEENT: Atraumatic,normocephalic. Pupils are equal and reactive to light. Oropharynx is clear with moist mucosa  Lungs: Clear to auscultation bilaterally   Cardiovascular: Regular rate and rhythm. Normal S1, S2. No murmurs. No appreciable gallops or rubs. Peripheral pulses are palpable.  Abdomen: Soft, non-tender, nondistended. Bowel sounds present  Rightshoulder exam: SKIN:Normal SWELLING:None WARMTH:None LYMPH NODES: No adenopathy palpable CREPITUS:Mild glenohumeral crepitance TENDERNESS:Non-tender ROM (active):  Forward flexion:65 degrees Abduction: 55 degrees Internal rotation: L3 ROM (passive):  Forward flexion: 150 degrees Abduction:145 degrees ER/IR at 90 abd: 85 degrees / 55 degrees  He experiences mild-moderate pain and crepitance with all motions.  STRENGTH: Forward flexion: 2-2+/5 Abduction: 2-2+/5 External rotation: 3/5 Internal rotation: 3+-4/5 Pain with RC testing: Mild pain with resisted strength testing in all planes.  STABILITY:Normal  SPECIAL TESTS: Luan Pulling' test: Mildly positive Speed's test: Mildly positive Capsulitis - pain w/ passive ER: No Crossed arm test:  Negative Crank: Not evaluated Anterior apprehension: Negative Posterior apprehension: Not evaluated  Neurological: The patient is alert and oriented Sensation to light touch appears to be intact and within normal limits Gross motor strength appeared to be equal to 5/5  Vascular : Peripheral pulses felt to be palpable. Capillary refill appears to be intact and within normal limits  X-ray: CT OF THE UPPER RIGHT EXTREMITY WITHOUT CONTRAST: 1. Considerable atrophy of the supraspinatus but also the infraspinatus and subscapularis muscles. The supraspinatus tendon is known to be ruptured, and based on the degree of atrophy, full-thickness tears or ruptures of the infraspinatus and subscapularis tendons are distinctly possible. 2. Prominent glenohumeral spurring with a chronically fragmented well corticated spur from the inferior glenoid. 3. Moderate degenerative AC joint arthropathy.  Impression: Chronic massive irreparable rotator cuff tear with advanced cuff arthropathy  Plan:  The treatment options were discussed with the patient. In addition, patient educational materials were provided regarding the diagnosis and treatment options. Regarding his right shoulder symptoms, he is becoming increasingly frustrated by his symptoms and functional limitations, and is ready to consider more aggressive treatment options. Therefore, I have recommended a surgical procedure, specifically a reverse right total shoulder arthroplasty. The procedure was discussed with the patient, as were the potential risks (including bleeding, infection, nerve and/or blood vessel injury, persistent or recurrent pain, loosening and/or failure of the components, dislocation, leg length inequality, need for further surgery, blood clots, strokes, heart attacks and/or arhythmias, pneumonia, etc.) and benefits. The  patient states his/her understanding and wishes to proceed. All of the patient's questions and concerns were answered. He can call any time with further concerns. He will follow up post-surgery, routine.   H&P reviewed and patient re-examined. No changes.

## 2020-04-10 ENCOUNTER — Encounter: Payer: Self-pay | Admitting: Surgery

## 2020-04-10 MED ORDER — BUPIVACAINE HCL (PF) 0.5 % IJ SOLN
INTRAMUSCULAR | Status: DC | PRN
  Administered 2020-04-09 (×2): 5 mL

## 2020-04-10 MED ORDER — BUPIVACAINE LIPOSOME 1.3 % IJ SUSP
INTRAMUSCULAR | Status: DC | PRN
  Administered 2020-04-09 (×4): 5 mL

## 2020-04-10 MED ORDER — BUPIVACAINE LIPOSOME 1.3 % IJ SUSP: INTRAMUSCULAR | Status: DC | PRN

## 2020-04-10 MED ORDER — LIDOCAINE HCL (PF) 1 % IJ SOLN
INTRAMUSCULAR | Status: DC | PRN
  Administered 2020-04-09: 2 mL

## 2020-04-10 NOTE — Anesthesia Procedure Notes (Addendum)
Anesthesia Regional Block: Interscalene brachial plexus block   Pre-Anesthetic Checklist: ,, timeout performed, Correct Patient, Correct Site, Correct Laterality, Correct Procedure, Correct Position, site marked, Risks and benefits discussed,  Surgical consent,  Pre-op evaluation,  At surgeon's request and post-op pain management  Laterality: Right  Prep: chloraprep       Needles:  Injection technique: Single-shot  Needle Type: Stimiplex     Needle Length: 9cm  Needle Gauge: 21     Additional Needles:   Procedures:,,,, ultrasound used (permanent image in chart),,,,  Narrative:  Start time: 04/09/2020 7:20 AM End time: 04/09/2020 7:25 AM  Performed by: Personally  Anesthesiologist: Tera Mater, MD  Additional Notes: Risks and benefits of nerve block discussed with patient, including but not limited to risk of nerve injury, bleeding, infection, and failed block.  Patient expressed understanding and consented to block placement.   Functioning IV was confirmed and monitors were applied.  Sterile prep,hand hygiene and sterile gloves were used.  Minimal sedation used for procedure.  During the procedure, there was negative aspiration, negative paresthesia on injection, and dose was given in divided aliquots under ultrasound guidance.  Patient tolerated the procedure well with no immediate complications.

## 2020-04-10 NOTE — Anesthesia Postprocedure Evaluation (Addendum)
Anesthesia Post Note  Patient: Patrick Mason  Procedure(s) Performed: REVERSE SHOULDER ARTHROPLASTY (Right Shoulder)  Patient location during evaluation: PACU Anesthesia Type: General Level of consciousness: awake and alert Pain management: pain level controlled Vital Signs Assessment: post-procedure vital signs reviewed and stable Respiratory status: spontaneous breathing, nonlabored ventilation and respiratory function stable Cardiovascular status: blood pressure returned to baseline and stable Postop Assessment: no apparent nausea or vomiting Anesthetic complications: no   No complications documented.   Last Vitals:  Vitals:   04/09/20 1440 04/09/20 1630  BP: 131/70 135/72  Pulse: 80 88  Resp: 16   Temp:    SpO2: 95% 97%    Last Pain:  Vitals:   04/10/20 0837  TempSrc:   PainSc: [place]

## 2020-04-11 LAB — SURGICAL PATHOLOGY

## 2021-03-23 ENCOUNTER — Other Ambulatory Visit: Payer: Self-pay

## 2021-03-23 DIAGNOSIS — R112 Nausea with vomiting, unspecified: Secondary | ICD-10-CM | POA: Diagnosis not present

## 2021-03-23 DIAGNOSIS — Z20822 Contact with and (suspected) exposure to covid-19: Secondary | ICD-10-CM | POA: Diagnosis not present

## 2021-03-23 DIAGNOSIS — Z8551 Personal history of malignant neoplasm of bladder: Secondary | ICD-10-CM | POA: Insufficient documentation

## 2021-03-23 DIAGNOSIS — K2980 Duodenitis without bleeding: Secondary | ICD-10-CM | POA: Diagnosis not present

## 2021-03-23 DIAGNOSIS — Z96642 Presence of left artificial hip joint: Secondary | ICD-10-CM | POA: Insufficient documentation

## 2021-03-23 DIAGNOSIS — I10 Essential (primary) hypertension: Secondary | ICD-10-CM | POA: Insufficient documentation

## 2021-03-23 DIAGNOSIS — Z79899 Other long term (current) drug therapy: Secondary | ICD-10-CM | POA: Diagnosis not present

## 2021-03-23 DIAGNOSIS — Z87891 Personal history of nicotine dependence: Secondary | ICD-10-CM | POA: Insufficient documentation

## 2021-03-23 DIAGNOSIS — R1030 Lower abdominal pain, unspecified: Secondary | ICD-10-CM | POA: Diagnosis present

## 2021-03-23 DIAGNOSIS — Z96611 Presence of right artificial shoulder joint: Secondary | ICD-10-CM | POA: Insufficient documentation

## 2021-03-23 DIAGNOSIS — K219 Gastro-esophageal reflux disease without esophagitis: Secondary | ICD-10-CM | POA: Insufficient documentation

## 2021-03-23 LAB — CBC
HCT: 43.3 % (ref 39.0–52.0)
Hemoglobin: 14.7 g/dL (ref 13.0–17.0)
MCH: 31.1 pg (ref 26.0–34.0)
MCHC: 33.9 g/dL (ref 30.0–36.0)
MCV: 91.5 fL (ref 80.0–100.0)
Platelets: 263 10*3/uL (ref 150–400)
RBC: 4.73 MIL/uL (ref 4.22–5.81)
RDW: 13.5 % (ref 11.5–15.5)
WBC: 11.1 10*3/uL — ABNORMAL HIGH (ref 4.0–10.5)
nRBC: 0 % (ref 0.0–0.2)

## 2021-03-23 LAB — COMPREHENSIVE METABOLIC PANEL
ALT: 266 U/L — ABNORMAL HIGH (ref 0–44)
AST: 42 U/L — ABNORMAL HIGH (ref 15–41)
Albumin: 3.4 g/dL — ABNORMAL LOW (ref 3.5–5.0)
Alkaline Phosphatase: 119 U/L (ref 38–126)
Anion gap: 9 (ref 5–15)
BUN: 15 mg/dL (ref 8–23)
CO2: 28 mmol/L (ref 22–32)
Calcium: 8.9 mg/dL (ref 8.9–10.3)
Chloride: 98 mmol/L (ref 98–111)
Creatinine, Ser: 0.87 mg/dL (ref 0.61–1.24)
GFR, Estimated: >60 mL/min (ref >60)
Glucose, Bld: 130 mg/dL — ABNORMAL HIGH (ref 70–99)
Potassium: 4 mmol/L (ref 3.5–5.1)
Sodium: 135 mmol/L (ref 135–145)
Total Bilirubin: 1.3 mg/dL — ABNORMAL HIGH (ref 0.3–1.2)
Total Protein: 7.7 g/dL (ref 6.5–8.1)

## 2021-03-23 LAB — LIPASE, BLOOD: Lipase: 36 U/L (ref 11–51)

## 2021-03-23 NOTE — ED Triage Notes (Addendum)
Pt c/o lower ABD pain that started 5 days ago. Per pt, pain radiates to lower back. Pt was seen in MD office Friday and given antibiotics. Pt had n/v. Pt had abnormal labs (AST/ALT). Pt hasn't been able to eat or drink without pain.

## 2021-03-24 ENCOUNTER — Emergency Department: Payer: 59

## 2021-03-24 ENCOUNTER — Emergency Department
Admission: EM | Admit: 2021-03-24 | Discharge: 2021-03-24 | Disposition: A | Discharge status: Home / Self Care | Payer: 59 | Attending: Emergency Medicine | Admitting: Emergency Medicine

## 2021-03-24 DIAGNOSIS — K2980 Duodenitis without bleeding: Secondary | ICD-10-CM

## 2021-03-24 LAB — URINALYSIS, COMPLETE (UACMP) WITH MICROSCOPIC
Bilirubin Urine: NEGATIVE
Glucose, UA: NEGATIVE mg/dL
Ketones, ur: 15 mg/dL — AB
Leukocytes,Ua: NEGATIVE
Nitrite: NEGATIVE
Protein, ur: NEGATIVE mg/dL
Specific Gravity, Urine: 1.01 (ref 1.005–1.030)
Squamous Epithelial / HPF: NONE SEEN (ref 0–5)
pH: 6 (ref 5.0–8.0)

## 2021-03-24 LAB — RESP PANEL BY RT-PCR (FLU A&B, COVID) ARPGX2
Influenza A by PCR: NEGATIVE
Influenza B by PCR: NEGATIVE
SARS Coronavirus 2 by RT PCR: NEGATIVE

## 2021-03-24 LAB — CK: Total CK: 253 U/L (ref 49–397)

## 2021-03-24 LAB — AMYLASE: Amylase: 44 U/L (ref 28–100)

## 2021-03-24 MED ORDER — HYDROMORPHONE HCL 1 MG/ML IJ SOLN
0.5000 mg | Freq: Once | INTRAMUSCULAR | Status: AC
Start: 2021-03-24 — End: 2021-03-24
  Administered 2021-03-24: 0.5 mg via INTRAVENOUS
  Filled 2021-03-24: qty 1

## 2021-03-24 MED ORDER — SUCRALFATE 1 G PO TABS: 1.0000 g | ORAL_TABLET | Freq: Three times a day (TID) | ORAL | 0 refills | Status: DC

## 2021-03-24 MED ORDER — SODIUM CHLORIDE 0.9 % IV BOLUS
1000.0000 mL | Freq: Once | INTRAVENOUS | Status: AC
  Administered 2021-03-24: 1000 mL via INTRAVENOUS

## 2021-03-24 MED ORDER — IOHEXOL 350 MG/ML SOLN
100.0000 mL | Freq: Once | INTRAVENOUS | Status: AC | PRN
  Administered 2021-03-24: 100 mL via INTRAVENOUS

## 2021-03-24 MED ORDER — PANTOPRAZOLE SODIUM 40 MG PO TBEC: 40.0000 mg | DELAYED_RELEASE_TABLET | Freq: Every day | ORAL | 0 refills | Status: DC

## 2021-03-24 MED ORDER — PANTOPRAZOLE SODIUM 40 MG IV SOLR
40.0000 mg | Freq: Once | INTRAVENOUS | Status: AC
  Administered 2021-03-24: 40 mg via INTRAVENOUS
  Filled 2021-03-24: qty 40

## 2021-03-24 MED ORDER — HYDROMORPHONE HCL 1 MG/ML IJ SOLN
0.5000 mg | Freq: Once | INTRAMUSCULAR | Status: AC
  Administered 2021-03-24: 0.5 mg via INTRAVENOUS
  Filled 2021-03-24: qty 1

## 2021-03-24 MED ORDER — ONDANSETRON 4 MG PO TBDP: 4.0000 mg | ORAL_TABLET | Freq: Three times a day (TID) | ORAL | 0 refills | Status: DC | PRN

## 2021-03-24 MED ORDER — ONDANSETRON HCL 4 MG/2ML IJ SOLN
4.0000 mg | Freq: Once | INTRAMUSCULAR | Status: AC
  Administered 2021-03-24: 4 mg via INTRAVENOUS
  Filled 2021-03-24: qty 2

## 2021-03-24 NOTE — Discharge Instructions (Addendum)
Take Tylenol 1 g every 8 hours to help with any pain.  Try to avoid ibuprofen, NSAIDs, aspirin, BC powder.  Take the Carafate to help line the stomach prior to eating to help decrease pain.  Take the Protonix to decrease acid.  Take Zofran to help with nausea.  Return to the ER if you are continuing not able to tolerate p.o.  Try to keep to a liquid diet to start slowly transition back up.   IMPRESSION: Extensive inflammatory changes surrounding the third portion of the duodenum and uncinate process of the pancreas. Milder inflammatory changes adjacent to the mid body of the pancreas. These findings may reflect changes of acute interstitial/edematous pancreatitis or focal duodenitis involving the third portion of the duodenum. Correlation with serum amylase and lipase may be helpful for confirmation. Associated probable reactive mesenteric adenopathy. No evidence of obstruction or perforation.   Moderate multi-vessel coronary artery calcification.   Aortic Atherosclerosis (ICD10-I70.0).

## 2021-03-24 NOTE — ED Provider Notes (Signed)
[facility] Emergency Department Provider Note  ____________________________________________   Event Date/Time   First MD Initiated Contact with Patient 03/24/21 0031     (approximate)  I have reviewed the triage vital signs and the nursing notes.   HISTORY  Chief Complaint Abdominal Pain    HPI Patrick Mason is a 65 y.o. male with hypertension, hyperlipidemia who comes in with concerns for abdominal pain.  Patient was seen at the MDs office on Friday and given antibiotics.  Both patient has been able to eat or drink.  On review of records patient was started on Augmentin.  Patient states that the pain initially started a few days before going into the doctor's office is been almost a week of pain.  The pain is severe, constant, lower abdomen wrapping around to his back, nothing makes it better or worse.  Does report still passing gas but did have 2 episodes of nonbloody nonbilious vomiting.  Does have a history of bladder cancer status postchemotherapy 5 years ago but reports that it is currently in remission.  Has had his gallbladder already removed.          Past Medical History:  Diagnosis Date   Arthritis    Cancer ([place]) 2017   posterior wall of bladder   Carpal tunnel syndrome    GERD (gastroesophageal reflux disease)    H/O   Hyperlipidemia    Hypertension    Pre-diabetes    Psoriasis    Right rotator cuff tear     Patient Active Problem List   Diagnosis Date Noted   Status post total hip replacement, left 11/14/2019    Past Surgical History:  Procedure Laterality Date   BILATERAL CARPAL TUNNEL RELEASE Bilateral 05/12/2017   Procedure: BILATERAL CARPAL TUNNEL RELEASE;  Surgeon: Leanor Kail, MD;  Location: [facility] ORS;  Service: Orthopedics;  Laterality: Bilateral;   CHOLECYSTECTOMY     COLONOSCOPY WITH PROPOFOL N/A 04/09/2017   Procedure: COLONOSCOPY WITH PROPOFOL;  Surgeon: Manya Silvas, MD;  Location: [facility] ENDOSCOPY;   Service: Endoscopy;  Laterality: N/A;   CYSTOURETHROSCOPY     HERNIA REPAIR Left    INGUINAL   REVERSE SHOULDER ARTHROPLASTY Right 04/09/2020   Procedure: REVERSE SHOULDER ARTHROPLASTY;  Surgeon: Corky Mull, MD;  Location: [facility] ORS;  Service: Orthopedics;  Laterality: Right;   TOTAL HIP ARTHROPLASTY Left 11/14/2019   Procedure: TOTAL HIP ARTHROPLASTY;  Surgeon: Corky Mull, MD;  Location: [facility] ORS;  Service: Orthopedics;  Laterality: Left;   TRANSURETHRAL RESECTION OF BLADDER TUMOR WITH GYRUS (TURBT-GYRUS)      Prior to Admission medications   Medication Sig Start Date End Date Taking? Authorizing Provider  acetaminophen (TYLENOL) 500 MG tablet Take 500 mg by mouth daily.     [provider]  atorvastatin (LIPITOR) 20 MG tablet Take 20 mg by mouth daily at 6 PM.  04/25/17   [provider]  clobetasol ointment (TEMOVATE) [id number] % Apply 1 application topically daily as needed (psoriasis).  10/05/19   [provider]  fluticasone (FLONASE) 50 MCG/ACT nasal spray Place 2 sprays into the nose daily as needed for allergies.     [provider]  guaiFENesin (MUCINEX) 600 MG 12 hr tablet Take 600 mg by mouth daily as needed for to loosen phlegm.    [provider]  ibuprofen (ADVIL) 200 MG tablet Take 200 mg by mouth daily. Additional as needed for pain    [provider]  olmesartan-hydrochlorothiazide (BENICAR HCT) 20-12.5  MG tablet Take 1 tablet by mouth daily.  08/21/19   [provider]  oxyCODONE (ROXICODONE) 5 MG immediate release tablet Take 1-2 tablets (5-10 mg total) by mouth every 4 (four) hours as needed for moderate pain or severe pain. 04/09/20   Poggi, Marshall Cork, MD    Allergies Patient has no known allergies.  No family history on file.  Social History Social History   Tobacco Use   Smoking status: Former    Packs/day: 2.00    Years: 40.00    Pack years: 80.00    Types: Cigarettes    Quit date: 11/07/2006    Years  since quitting: 14.3   Smokeless tobacco: Never  Vaping Use   Vaping Use: Never used  Substance Use Topics   Alcohol use: Yes    Comment: OCCASSIONAL   Drug use: No      Review of Systems Constitutional: No fever/chills Eyes: No visual changes. ENT: No sore throat. Cardiovascular: Denies chest pain. Respiratory: Denies shortness of breath. Gastrointestinal: Positive abdominal pain, nausea, vomiting Genitourinary: Negative for dysuria. Musculoskeletal: Negative for back pain. Skin: Negative for rash. Neurological: Negative for headaches, focal weakness or numbness. All other ROS negative ____________________________________________   PHYSICAL EXAM:  VITAL SIGNS: ED Triage Vitals  Enc Vitals Group     BP 03/23/21 2022 127/61     Pulse Rate 03/23/21 2022 100     Resp 03/23/21 2022 18     Temp 03/23/21 2022 98.6 F (37 C)     Temp Source 03/23/21 2022 Oral     SpO2 03/23/21 2022 99 %     Weight 03/23/21 2023 270 lb (122.5 kg)     Height 03/23/21 2023 '5\' 9"'$  (1.753 m)     Head Circumference --      Peak Flow --      Pain Score 03/23/21 2023 8     Pain Loc --      Pain Edu? --      Excl. in [place]? --     Constitutional: Alert and oriented. Well appearing and in no acute distress. Eyes: Conjunctivae are normal. EOMI. Head: Atraumatic. Nose: No congestion/rhinnorhea. Mouth/Throat: Mucous membranes are moist.   Neck: No stridor. Trachea Midline. FROM Cardiovascular: Normal rate, regular rhythm. Grossly normal heart sounds.  Good peripheral circulation. Respiratory: Normal respiratory effort.  No retractions. Lungs CTAB. Gastrointestinal: Tender in the lower abdomen.  . No distention. No abdominal bruits.  Musculoskeletal: No lower extremity tenderness nor edema.  No joint effusions. Neurologic:  Normal speech and language. No gross focal neurologic deficits are appreciated.  Skin:  Skin is warm, dry and intact. No rash noted. Psychiatric: Mood and affect are normal.  Speech and behavior are normal. GU: Deferred   ____________________________________________   LABS (all labs ordered are listed, but only abnormal results are displayed)  Labs Reviewed  COMPREHENSIVE METABOLIC PANEL - Abnormal; Notable for the following components:      Result Value   Glucose, Bld 130 (*)    Albumin 3.4 (*)    AST 42 (*)    ALT 266 (*)    Total Bilirubin 1.3 (*)    All other components within normal limits  CBC - Abnormal; Notable for the following components:   WBC 11.1 (*)    All other components within normal limits  LIPASE, BLOOD  URINALYSIS, COMPLETE (UACMP) WITH MICROSCOPIC   ____________________________________________  RADIOLOGY   Official radiology report(s): CT ABDOMEN PELVIS W CONTRAST  Result Date: 03/24/2021 CLINICAL  DATA:  Abdominal distension. Lower abdominal pain, nausea, vomiting EXAM: CT ABDOMEN AND PELVIS WITH CONTRAST TECHNIQUE: Multidetector CT imaging of the abdomen and pelvis was performed using the standard protocol following bolus administration of intravenous contrast. CONTRAST:  193m OMNIPAQUE IOHEXOL 350 MG/ML SOLN COMPARISON:  None. FINDINGS: Lower chest: Moderate multi-vessel coronary artery calcification. Global cardiac size within normal limits. Mild left basilar atelectasis. Hepatobiliary: No focal liver abnormality is seen. Status post cholecystectomy. No biliary dilatation. Pancreas: There are extensive inflammatory changes surrounding the uncinate process of the pancreas extending into the mesenteric root and surrounding the third portion of the duodenum. These may reflect changes of acute interstitial/edematous pancreatitis involving the uncinate process of the pancreas mild similar peripancreatic inflammatory changes are seen involving the mid body of the pancreas. There is, however, mucosal thickening and submucosal edema involving the third portion of the duodenum and alternatively, this may reflect changes of a primary  duodenitis. The pancreatic duct is not dilated. Normal enhancement of the pancreatic parenchyma. No loculated peripancreatic fluid collections. Spleen: Unremarkable Adrenals/Urinary Tract: Adrenal glands are unremarkable. Kidneys are normal, without renal calculi, focal lesion, or hydronephrosis. Bladder is unremarkable. Stomach/Bowel: Aside from inflammatory changes involving the third portion of the duodenum, the stomach, small bowel, and large bowel are unremarkable. Appendix normal. No free intraperitoneal gas or fluid. Vascular/Lymphatic: There is shotty mesenteric adenopathy within the mesenteric root adjacent to inflammatory changes involving the uncinate process of the pancreas and third portion of the duodenum. No frankly pathologic adenopathy within the abdomen and pelvis. Mild aortoiliac atherosclerotic calcification. No aortic aneurysm. The superior and inferior mesenteric veins, splenic vein, and portal vein are patent. Reproductive: Prostate is unremarkable. Other: No abdominal wall hernia.  Rectum unremarkable. Musculoskeletal: Left total hip arthroplasty has been performed. Bilateral L5 pars defects are present with grade 1 anterolisthesis of L5 upon S1. Degenerative changes are seen within the lumbar spine. No acute bone abnormality. No lytic or blastic bone lesions. IMPRESSION: Extensive inflammatory changes surrounding the third portion of the duodenum and uncinate process of the pancreas. Milder inflammatory changes adjacent to the mid body of the pancreas. These findings may reflect changes of acute interstitial/edematous pancreatitis or focal duodenitis involving the third portion of the duodenum. Correlation with serum amylase and lipase may be helpful for confirmation. Associated probable reactive mesenteric adenopathy. No evidence of obstruction or perforation. Moderate multi-vessel coronary artery calcification. Aortic Atherosclerosis (ICD10-I70.0). Electronically Signed   By: AFidela SalisburyM.D.   On: 03/24/2021 01:53    ____________________________________________   PROCEDURES  Procedure(s) performed (including Critical Care):  Procedures   ____________________________________________   INITIAL IMPRESSION / ASSESSMENT AND PLAN / ED COURSE  Patrick Mason was evaluated in Emergency Department on 03/24/2021 for the symptoms described in the history of present illness. He was evaluated in the context of the global COVID-19 pandemic, which necessitated consideration that the patient might be at risk for infection with the SARS-CoV-2 virus that causes COVID-19. Institutional protocols and algorithms that pertain to the evaluation of patients at risk for COVID-19 are in a state of rapid change based on information released by regulatory bodies including the CDC and federal and state organizations. These policies and algorithms were followed during the patient's care in the ED.    Patient comes in with abdominal pain nausea, vomiting.  CT scan was ordered to evaluate for appendicitis, perforation, abscess, obstruction or other acute pathology.  Patient was given 1 L of fluid, pain medication and Zofran  Labs show slightly  elevated LFTs but patient started his gallbladder removed and the CT scan does not show any signs of biliary dilation this is been going on for over a weeks I doubt that this is a choledocholithiasis.  However there is concern for potential pancreatitis versus duodenitis.  Patient's pancreatitis enzymes are normal suspect this is more like a duodenitis.  We discussed PPI and following up with GI outpatient and taking Tylenol, ibuprofen for pain.  Also provided some Zofran.  Offered admission if he was having continued pain, nausea, vomiting the patient declined.  He was able to tolerate a p.o. challenge and is requesting discharge home.  There is also some incidental findings on his CT scan including moderate multivessel coronary artery calcification which I  instructed him to follow-up with a cardiologist for       ____________________________________________   FINAL CLINICAL IMPRESSION(S) / ED DIAGNOSES   Final diagnoses:  Duodenitis      MEDICATIONS GIVEN DURING THIS VISIT:  Medications  sodium chloride 0.9 % bolus 1,000 mL (1,000 mLs Intravenous New Bag/Given 03/24/21 0104)  HYDROmorphone (DILAUDID) injection 0.5 mg (0.5 mg Intravenous Given 03/24/21 0104)  ondansetron (ZOFRAN) injection 4 mg (4 mg Intravenous Given 03/24/21 0103)  HYDROmorphone (DILAUDID) injection 0.5 mg (0.5 mg Intravenous Given 03/24/21 0125)  iohexol (OMNIPAQUE) 350 MG/ML injection 100 mL (100 mLs Intravenous Contrast Given 03/24/21 0138)  pantoprazole (PROTONIX) injection 40 mg (40 mg Intravenous Given 03/24/21 0303)     ED Discharge Orders          Ordered    ondansetron (ZOFRAN ODT) 4 MG disintegrating tablet  Every 8 hours PRN        03/24/21 0358    pantoprazole (PROTONIX) 40 MG tablet  Daily        03/24/21 0358    sucralfate (CARAFATE) 1 g tablet  3 times daily with meals & bedtime        03/24/21 0358             Note:  This document was prepared using Dragon voice recognition software and may include unintentional dictation errors.    Vanessa Woodland, MD 03/24/21 [phone]

## 2021-03-24 NOTE — ED Notes (Signed)
PO test pass

## 2021-03-24 NOTE — ED Notes (Signed)
Family at bedside. 

## 2021-05-20 ENCOUNTER — Ambulatory Visit: Payer: 59 | Admitting: Gastroenterology

## 2023-08-18 IMAGING — CT CT ABD-PELV W/ CM
2 of 5 series · 15 of 46 positions shown, 17 images · IV contrast (APPLIED)
Comparison: None.

CLINICAL DATA: Abdominal distension. Lower abdominal pain, nausea,
vomiting

EXAM:
CT ABDOMEN AND PELVIS WITH CONTRAST
TECHNIQUE: Multidetector CT imaging of the abdomen and pelvis was performed
using the standard protocol following bolus administration of
intravenous contrast.
CONTRAST:  100mL OMNIPAQUE IOHEXOL 350 MG/ML SOLN

[Series 2: routine abd/pel with · axial · 0.98mm/px · z∈[-946,-491]mm · 12 of 103 slices shown, 14 images]
[im 6/103  soft-tissue]
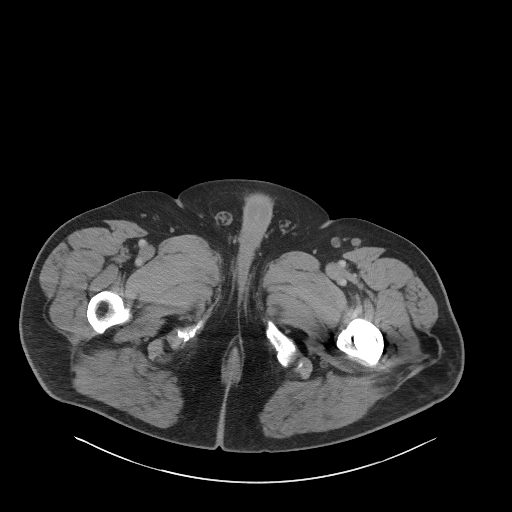
[im 6/103  bone]
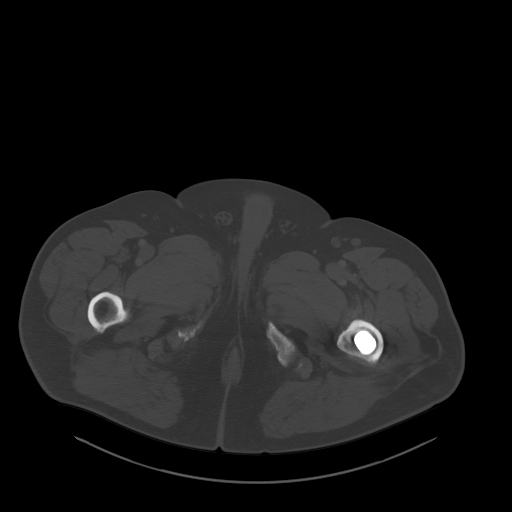
[im 17/103  soft-tissue]
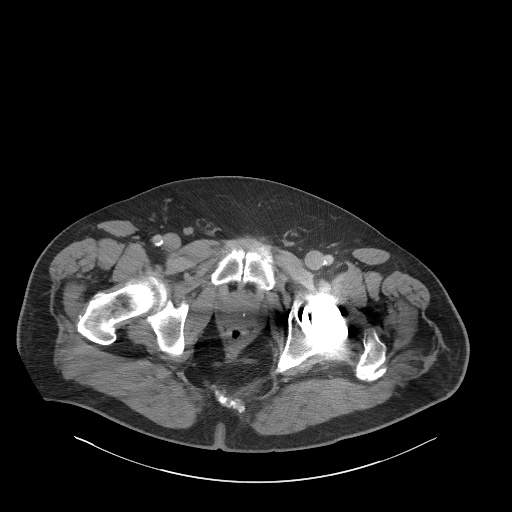
[im 22/103  soft-tissue]
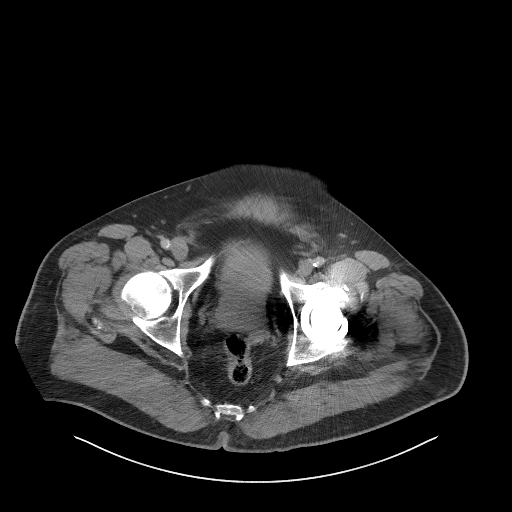
[im 33/103  soft-tissue]
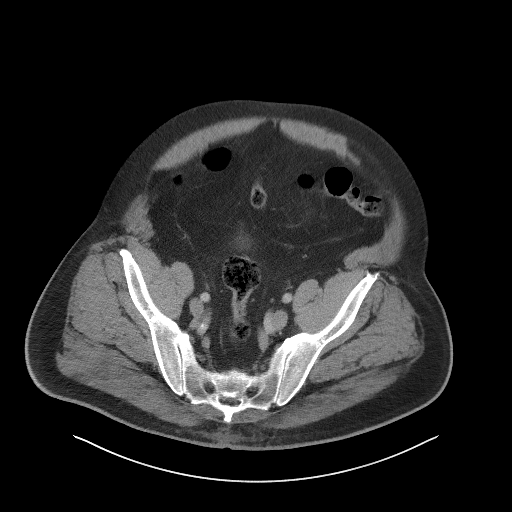
[im 38/103  soft-tissue]
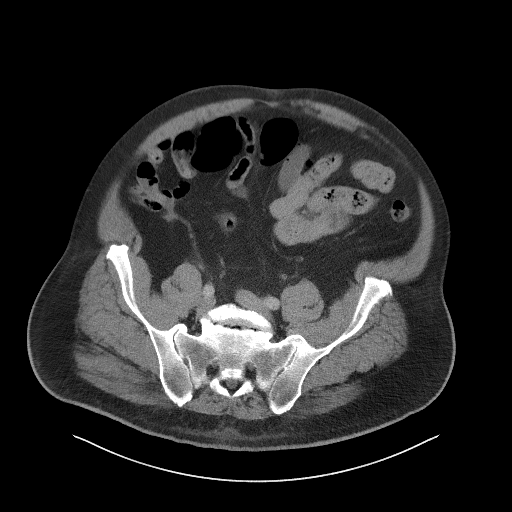
[im 49/103  soft-tissue]
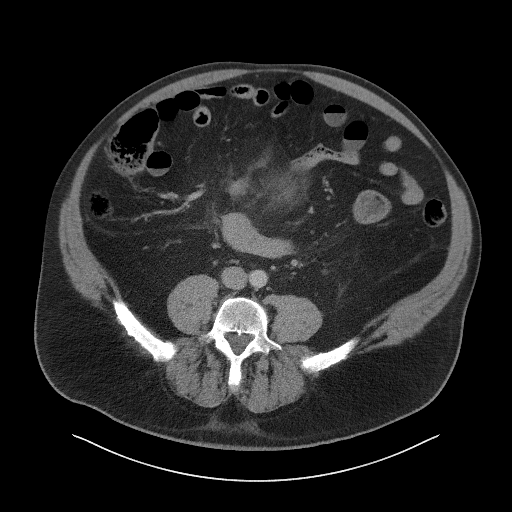
[im 54/103  soft-tissue]
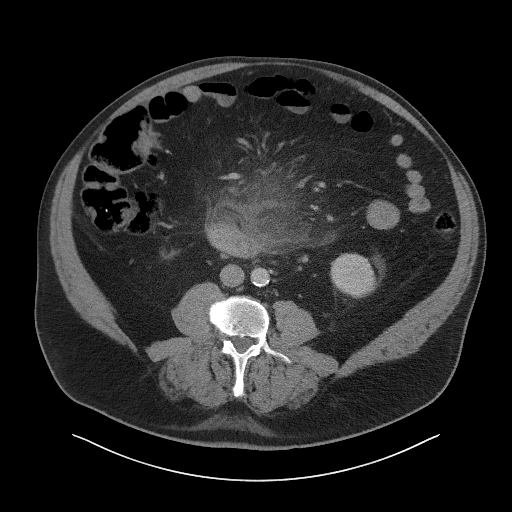
[im 65/103  soft-tissue]
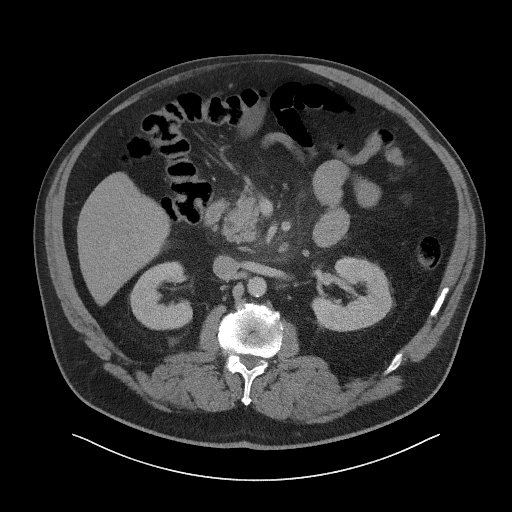
[im 70/103  soft-tissue]
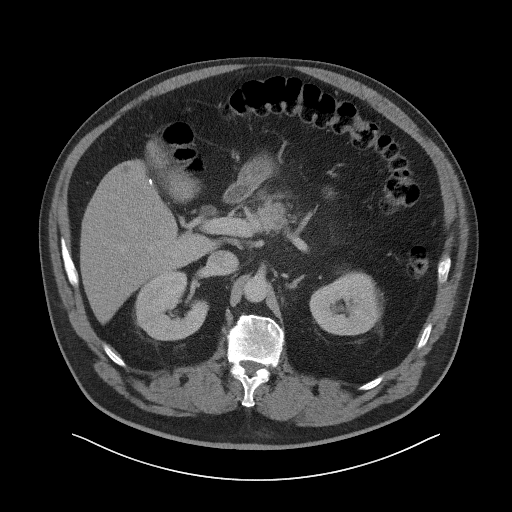
[im 70/103  bone]
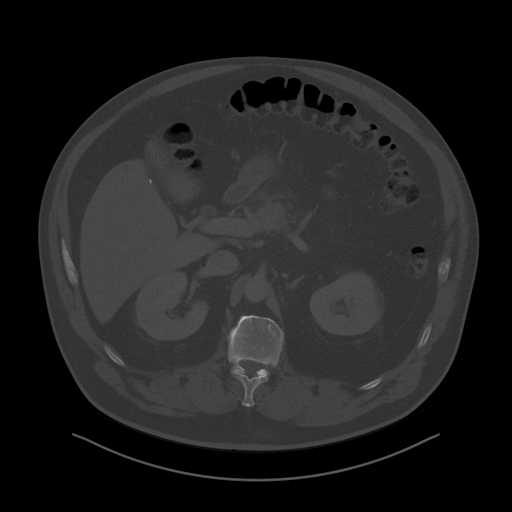
[im 81/103  soft-tissue]
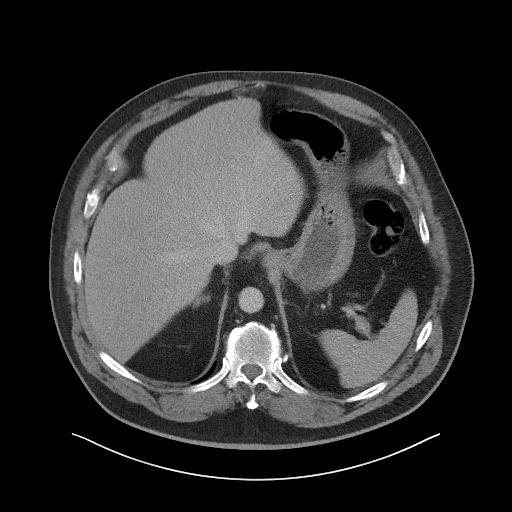
[im 86/103  soft-tissue]
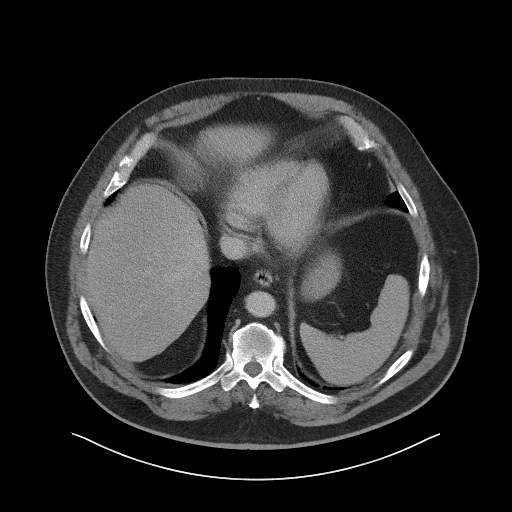
[im 97/103  soft-tissue]
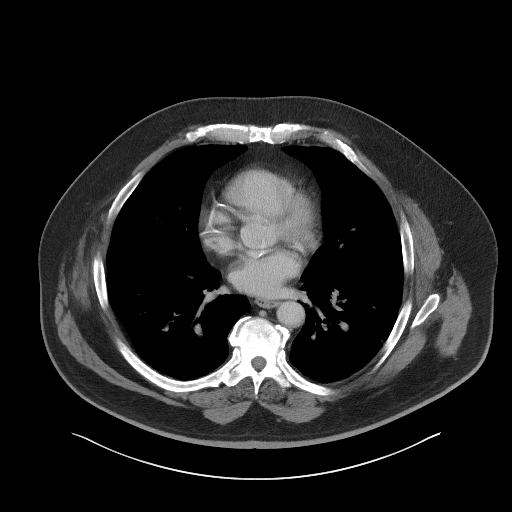

[Series 5: coronal st · coronal · 0.90mm/px · 3 of 132 slices shown]
[im 44/132  soft-tissue]
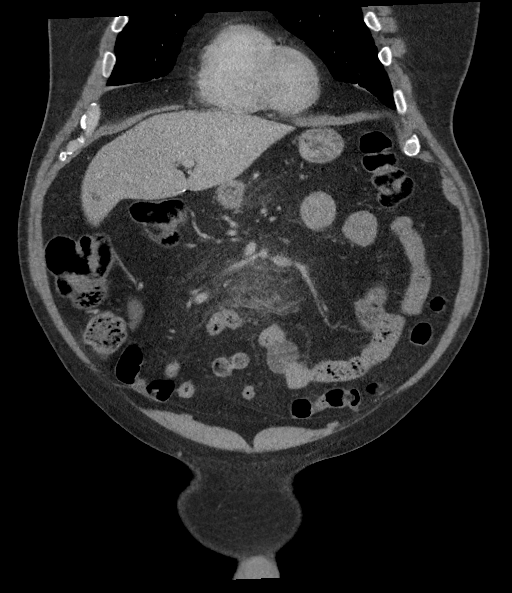
[im 59/132  soft-tissue]
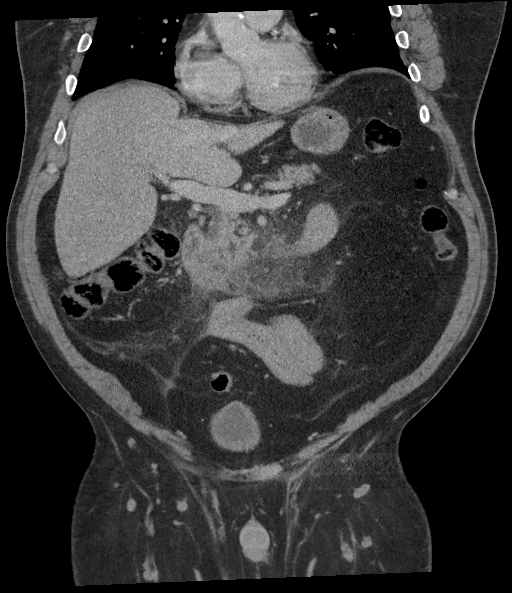
[im 73/132  soft-tissue]
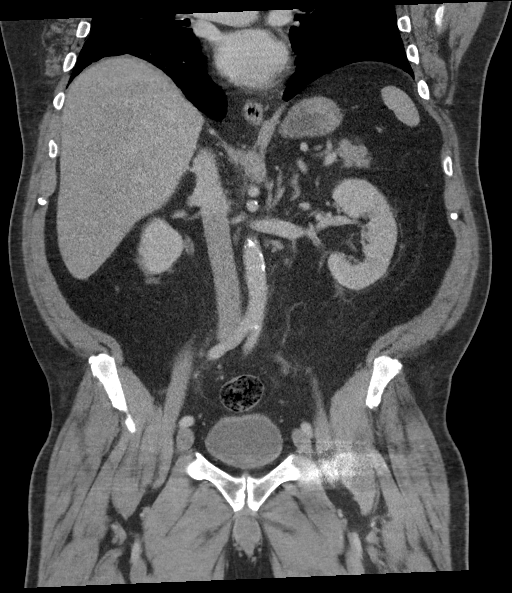

[15 of 46 positions shown; findings below may reference images not displayed]

FINDINGS: Lower chest: Moderate multi-vessel coronary artery calcification.
Global cardiac size within normal limits. Mild left basilar
atelectasis.

Hepatobiliary: No focal liver abnormality is seen. Status post
cholecystectomy. No biliary dilatation.

Pancreas: There are extensive inflammatory changes surrounding the
uncinate process of the pancreas extending into the mesenteric root
and surrounding the third portion of the duodenum. These may reflect
changes of acute interstitial/edematous pancreatitis involving the
uncinate process of the pancreas mild similar peripancreatic
inflammatory changes are seen involving the mid body of the
pancreas. There is, however, mucosal thickening and submucosal edema
involving the third portion of the duodenum and alternatively, this
may reflect changes of a primary duodenitis. The pancreatic duct is
not dilated. Normal enhancement of the pancreatic parenchyma. No
loculated peripancreatic fluid collections.

Spleen: Unremarkable

Adrenals/Urinary Tract: Adrenal glands are unremarkable. Kidneys are
normal, without renal calculi, focal lesion, or hydronephrosis.
Bladder is unremarkable.

Stomach/Bowel: Aside from inflammatory changes involving the third
portion of the duodenum, the stomach, small bowel, and large bowel
are unremarkable. Appendix normal. No free intraperitoneal gas or
fluid.

Vascular/Lymphatic: There is shotty mesenteric adenopathy within the
mesenteric root adjacent to inflammatory changes involving the
uncinate process of the pancreas and third portion of the duodenum.
No frankly pathologic adenopathy within the abdomen and pelvis. Mild
aortoiliac atherosclerotic calcification. No aortic aneurysm. The
superior and inferior mesenteric veins, splenic vein, and portal
vein are patent.

Reproductive: Prostate is unremarkable.

Other: No abdominal wall hernia.  Rectum unremarkable.

Musculoskeletal: Left total hip arthroplasty has been performed.
Bilateral L5 pars defects are present with grade 1 anterolisthesis
of L5 upon S1. Degenerative changes are seen within the lumbar
spine. No acute bone abnormality. No lytic or blastic bone lesions.
IMPRESSION: Extensive inflammatory changes surrounding the third portion of the
duodenum and uncinate process of the pancreas. Milder inflammatory
changes adjacent to the mid body of the pancreas. These findings may
reflect changes of acute interstitial/edematous pancreatitis or
focal duodenitis involving the third portion of the duodenum.
Correlation with serum amylase and lipase may be helpful for
confirmation. Associated probable reactive mesenteric adenopathy. No
evidence of obstruction or perforation.

Moderate multi-vessel coronary artery calcification.

Aortic Atherosclerosis ([id number].A).

## 2023-12-14 ENCOUNTER — Encounter: Payer: Self-pay | Admitting: *Deleted

## 2024-01-07 ENCOUNTER — Ambulatory Visit: Admission: RE | Admit: 2024-01-07 | Source: Home / Self Care

## 2024-01-07 ENCOUNTER — Encounter: Admission: RE | Payer: Self-pay | Source: Home / Self Care

## 2024-01-07 HISTORY — DX: Malignant neoplasm of bladder, unspecified: C67.9

## 2024-01-07 SURGERY — COLONOSCOPY
Anesthesia: General

## 2024-06-21 ENCOUNTER — Ambulatory Visit: Admitting: Urology

## 2024-06-21 ENCOUNTER — Encounter: Payer: Self-pay | Admitting: Urology

## 2024-06-21 VITALS — BP 174/75 | HR 98 | Ht 69.0 in | Wt 275.0 lb

## 2024-06-21 DIAGNOSIS — R972 Elevated prostate specific antigen [PSA]: Secondary | ICD-10-CM

## 2024-06-21 DIAGNOSIS — Z8551 Personal history of malignant neoplasm of bladder: Secondary | ICD-10-CM

## 2024-06-21 NOTE — Progress Notes (Unsigned)
 06/21/2024 8:25 AM   Patrick Mason [birth year] [id number]  Referring provider: Rudolpho Norleen BIRCH, MD [address] [facility] [place],  [place] [address]  Chief Complaint  Patient presents with   Elevated PSA    HPI: Patrick Mason is a 68 y.o. male referred for evaluation of an elevated PSA.  PSA levels: 6.96 on 05/25/2024 Negative urinalysis at the time of the above PSA Baseline PSA level: See below Lower urinary tract symptoms: Family history prostate cancer first-degree relatives: Past urologic history: Urothelial carcinoma the bladder previously followed by Dr. Casimir at [facility]  PSA trend    Prostate Specific Ag, Serum  Latest Ref Rng 0.0 - 4.0 ng/mL                      PMH: Past Medical History:  Diagnosis Date   Arthritis    Bladder cancer (HCC)    Cancer ([facility]CC) 2017   posterior wall of bladder   Carpal tunnel syndrome    GERD (gastroesophageal reflux disease)    H/O   Hyperlipidemia    Hypertension    Pre-diabetes    Psoriasis    Right rotator cuff tear     Surgical History: Past Surgical History:  Procedure Laterality Date   BILATERAL CARPAL TUNNEL RELEASE Bilateral 05/12/2017   Procedure: BILATERAL CARPAL TUNNEL RELEASE;  Surgeon: Maryl Barters, MD;  Location: [facility] ORS;  Service: Orthopedics;  Laterality: Bilateral;   CHOLECYSTECTOMY     COLONOSCOPY WITH PROPOFOL  N/A 04/09/2017   Procedure: COLONOSCOPY WITH PROPOFOL ;  Surgeon: Viktoria Lamar DASEN, MD;  Location: [facility] ENDOSCOPY;  Service: Endoscopy;  Laterality: N/A;   CYSTOURETHROSCOPY     HERNIA REPAIR Left    INGUINAL   JOINT REPLACEMENT     REVERSE SHOULDER ARTHROPLASTY Right 04/09/2020   Procedure: REVERSE SHOULDER ARTHROPLASTY;  Surgeon: Edie Norleen PARAS, MD;  Location: [facility] ORS;  Service: Orthopedics;  Laterality: Right;   TOTAL HIP ARTHROPLASTY Left 11/14/2019   Procedure: TOTAL HIP ARTHROPLASTY;  Surgeon: Edie Norleen PARAS, MD;  Location: [facility] ORS;  Service: Orthopedics;   Laterality: Left;   TRANSURETHRAL RESECTION OF BLADDER TUMOR WITH GYRUS (TURBT-GYRUS)      Home Medications:  Allergies as of 06/21/2024   No Known Allergies      Medication List        Accurate as of June 21, 2024  8:25 AM. If you have any questions, ask your nurse or doctor.          STOP taking these medications    ondansetron  4 MG disintegrating tablet Commonly known as: Zofran  ODT Stopped by: Glendia JAYSON Barba   oxyCODONE  5 MG immediate release tablet Commonly known as: Roxicodone  Stopped by: Glendia JAYSON Barba   pantoprazole  40 MG tablet Commonly known as: Protonix  Stopped by: Glendia JAYSON Barba   sucralfate  1 g tablet Commonly known as: Carafate  Stopped by: Glendia JAYSON Barba       TAKE these medications    acetaminophen  500 MG tablet Commonly known as: TYLENOL  Take 500 mg by mouth daily.   atorvastatin  20 MG tablet Commonly known as: LIPITOR Take 20 mg by mouth daily at 6 PM.   clobetasol  ointment 0.05 % Commonly known as: TEMOVATE  Apply 1 application topically daily as needed (psoriasis).   fluticasone  50 MCG/ACT nasal spray Commonly known as: FLONASE  Place 2 sprays into the nose daily as needed for allergies.   guaiFENesin 600 MG 12 hr tablet Commonly known as: MUCINEX Take 600  mg by mouth daily as needed for to loosen phlegm.   ibuprofen 200 MG tablet Commonly known as: ADVIL Take 200 mg by mouth daily. Additional as needed for pain   olmesartan -hydrochlorothiazide  20-12.5 MG tablet Commonly known as: BENICAR  HCT Take 1 tablet by mouth daily.        Allergies: No Known Allergies  Family History: No family history on file.  Social History:  reports that he quit smoking about 17 years ago. His smoking use included cigarettes. He started smoking about 57 years ago. He has a 80 pack-year smoking history. He has never used smokeless tobacco. He reports current alcohol use. He reports that he does not use drugs.   Physical Exam: BP (!)  174/75   Pulse 98   Ht 5' 9 (1.753 m)   Wt 275 lb (124.7 kg)   BMI 40.61 kg/m   Constitutional:  Alert and oriented, No acute distress. HEENT: [place] AT Respiratory: Normal respiratory effort, no increased work of breathing. GU: Prostate only lower one half palpable secondary to body habitus.  No nodules or induration.  Estimated size 40 g Psychiatric: Normal mood and affect.   Assessment & Plan:    1.  Elevated PSA Although PSA is a prostate cancer screening test he was informed that cancer is not the most common cause of an elevated PSA. Other potential causes including BPH and inflammation were discussed.  He was informed that the only way to adequately diagnose prostate cancer would be transrectal ultrasound and biopsy of the prostate. The procedure was discussed including potential risks of bleeding and infection/sepsis. He was also informed that a negative biopsy does not conclusively rule out the possibility that prostate cancer may be present and that continued monitoring is required.  The use of newer adjunctive blood and urine tests to predict the probability of high-grade prostate cancer were discussed. The use of multiparametric prostate MRI to evaluate for lesions suspicious for high grade prostate cancer and aid in targeted bx was reviewed.  Continued periodic surveillance was also discussed. Initially recommended a repeat PSA to rule out transient elevation secondary to prostatic inflammation.  If PSA remains elevated recommend prostate MRI  2.  History urothelial carcinoma bladder [facility] records reviewed after patient left. He last saw Dr. Casimir 07/10/2021 and cystoscopy June/2023 was recommended. Will reach out to patient for follow-up at [facility] or cystoscopy here if desired   Glendia JAYSON Barba, MD  [facility] [address] [place], [place] [address] [phone]

## 2024-06-22 ENCOUNTER — Ambulatory Visit: Payer: Self-pay | Admitting: Urology

## 2024-06-22 ENCOUNTER — Encounter: Payer: Self-pay | Admitting: Urology

## 2024-06-22 LAB — PSA: Prostate Specific Ag, Serum: 3 ng/mL (ref 0.0–4.0)
